# Patient Record
Sex: Male | Born: 1995 | Race: White | Hispanic: No | Marital: Single | Smoking: Current some day smoker
Health system: Southern US, Community
[De-identification: ages and names within clinical notes are randomized; demographics above are authoritative.]

## PROBLEM LIST (undated history)

## (undated) DIAGNOSIS — F913 Oppositional defiant disorder: Secondary | ICD-10-CM

## (undated) DIAGNOSIS — R480 Dyslexia and alexia: Secondary | ICD-10-CM

## (undated) DIAGNOSIS — F909 Attention-deficit hyperactivity disorder, unspecified type: Secondary | ICD-10-CM

## (undated) HISTORY — PX: APPENDECTOMY: SHX54

## (undated) HISTORY — PX: FRACTURE SURGERY: SHX138

---
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
## 2017-02-25 DIAGNOSIS — F902 Attention-deficit hyperactivity disorder, combined type: Secondary | ICD-10-CM | POA: Diagnosis not present

## 2017-05-23 DIAGNOSIS — F902 Attention-deficit hyperactivity disorder, combined type: Secondary | ICD-10-CM | POA: Diagnosis not present

## 2017-06-02 DIAGNOSIS — F4323 Adjustment disorder with mixed anxiety and depressed mood: Secondary | ICD-10-CM | POA: Diagnosis not present

## 2017-09-10 DIAGNOSIS — F902 Attention-deficit hyperactivity disorder, combined type: Secondary | ICD-10-CM | POA: Diagnosis not present

## 2017-10-24 DIAGNOSIS — F331 Major depressive disorder, recurrent, moderate: Secondary | ICD-10-CM | POA: Diagnosis not present

## 2017-10-27 DIAGNOSIS — F331 Major depressive disorder, recurrent, moderate: Secondary | ICD-10-CM | POA: Diagnosis not present

## 2017-10-29 DIAGNOSIS — F331 Major depressive disorder, recurrent, moderate: Secondary | ICD-10-CM | POA: Diagnosis not present

## 2017-10-31 DIAGNOSIS — F331 Major depressive disorder, recurrent, moderate: Secondary | ICD-10-CM | POA: Diagnosis not present

## 2017-11-06 DIAGNOSIS — H6692 Otitis media, unspecified, left ear: Secondary | ICD-10-CM | POA: Diagnosis not present

## 2017-11-06 DIAGNOSIS — F17200 Nicotine dependence, unspecified, uncomplicated: Secondary | ICD-10-CM | POA: Diagnosis not present

## 2017-11-06 DIAGNOSIS — R070 Pain in throat: Secondary | ICD-10-CM | POA: Diagnosis not present

## 2017-11-07 DIAGNOSIS — F331 Major depressive disorder, recurrent, moderate: Secondary | ICD-10-CM | POA: Diagnosis not present

## 2017-11-11 DIAGNOSIS — F331 Major depressive disorder, recurrent, moderate: Secondary | ICD-10-CM | POA: Diagnosis not present

## 2017-11-12 DIAGNOSIS — F902 Attention-deficit hyperactivity disorder, combined type: Secondary | ICD-10-CM | POA: Diagnosis not present

## 2017-11-17 DIAGNOSIS — F331 Major depressive disorder, recurrent, moderate: Secondary | ICD-10-CM | POA: Diagnosis not present

## 2017-11-19 DIAGNOSIS — F331 Major depressive disorder, recurrent, moderate: Secondary | ICD-10-CM | POA: Diagnosis not present

## 2017-11-20 DIAGNOSIS — F331 Major depressive disorder, recurrent, moderate: Secondary | ICD-10-CM | POA: Diagnosis not present

## 2017-11-25 DIAGNOSIS — F331 Major depressive disorder, recurrent, moderate: Secondary | ICD-10-CM | POA: Diagnosis not present

## 2017-12-10 DIAGNOSIS — F331 Major depressive disorder, recurrent, moderate: Secondary | ICD-10-CM | POA: Diagnosis not present

## 2018-03-30 DIAGNOSIS — J0100 Acute maxillary sinusitis, unspecified: Secondary | ICD-10-CM | POA: Diagnosis not present

## 2018-03-30 DIAGNOSIS — R110 Nausea: Secondary | ICD-10-CM | POA: Diagnosis not present

## 2018-03-30 DIAGNOSIS — R509 Fever, unspecified: Secondary | ICD-10-CM | POA: Diagnosis not present

## 2018-05-08 DIAGNOSIS — H6502 Acute serous otitis media, left ear: Secondary | ICD-10-CM | POA: Diagnosis not present

## 2018-05-08 DIAGNOSIS — J398 Other specified diseases of upper respiratory tract: Secondary | ICD-10-CM | POA: Diagnosis not present

## 2018-05-08 DIAGNOSIS — R110 Nausea: Secondary | ICD-10-CM | POA: Diagnosis not present

## 2018-05-08 DIAGNOSIS — Z6820 Body mass index (BMI) 20.0-20.9, adult: Secondary | ICD-10-CM | POA: Diagnosis not present

## 2018-05-08 DIAGNOSIS — F17290 Nicotine dependence, other tobacco product, uncomplicated: Secondary | ICD-10-CM | POA: Diagnosis not present

## 2018-06-18 DIAGNOSIS — J029 Acute pharyngitis, unspecified: Secondary | ICD-10-CM | POA: Diagnosis not present

## 2018-06-18 DIAGNOSIS — F329 Major depressive disorder, single episode, unspecified: Secondary | ICD-10-CM | POA: Diagnosis not present

## 2018-06-23 DIAGNOSIS — F902 Attention-deficit hyperactivity disorder, combined type: Secondary | ICD-10-CM | POA: Diagnosis not present

## 2018-07-30 DIAGNOSIS — F902 Attention-deficit hyperactivity disorder, combined type: Secondary | ICD-10-CM | POA: Diagnosis not present

## 2018-09-07 DIAGNOSIS — J189 Pneumonia, unspecified organism: Secondary | ICD-10-CM | POA: Diagnosis not present

## 2018-09-07 DIAGNOSIS — R05 Cough: Secondary | ICD-10-CM | POA: Diagnosis not present

## 2018-09-07 DIAGNOSIS — J209 Acute bronchitis, unspecified: Secondary | ICD-10-CM | POA: Diagnosis not present

## 2018-10-28 ENCOUNTER — Other Ambulatory Visit: Payer: Self-pay | Admitting: Psychiatry

## 2018-10-29 NOTE — Telephone Encounter (Signed)
Check appt

## 2018-10-30 DIAGNOSIS — J029 Acute pharyngitis, unspecified: Secondary | ICD-10-CM | POA: Diagnosis not present

## 2018-11-05 ENCOUNTER — Ambulatory Visit (INDEPENDENT_AMBULATORY_CARE_PROVIDER_SITE_OTHER): Payer: BLUE CROSS/BLUE SHIELD | Admitting: Psychiatry

## 2018-11-05 ENCOUNTER — Encounter: Payer: Self-pay | Admitting: Psychiatry

## 2018-11-05 VITALS — BP 110/80 | HR 72 | Ht 69.0 in | Wt 154.0 lb

## 2018-11-05 DIAGNOSIS — F3342 Major depressive disorder, recurrent, in full remission: Secondary | ICD-10-CM | POA: Insufficient documentation

## 2018-11-05 DIAGNOSIS — H9325 Central auditory processing disorder: Secondary | ICD-10-CM

## 2018-11-05 DIAGNOSIS — F902 Attention-deficit hyperactivity disorder, combined type: Secondary | ICD-10-CM

## 2018-11-05 DIAGNOSIS — F1210 Cannabis abuse, uncomplicated: Secondary | ICD-10-CM | POA: Diagnosis not present

## 2018-11-05 DIAGNOSIS — F422 Mixed obsessional thoughts and acts: Secondary | ICD-10-CM

## 2018-11-05 DIAGNOSIS — F330 Major depressive disorder, recurrent, mild: Secondary | ICD-10-CM | POA: Diagnosis not present

## 2018-11-05 DIAGNOSIS — F810 Specific reading disorder: Secondary | ICD-10-CM

## 2018-11-05 DIAGNOSIS — F3341 Major depressive disorder, recurrent, in partial remission: Secondary | ICD-10-CM | POA: Insufficient documentation

## 2018-11-05 MED ORDER — SUVOREXANT 15 MG PO TABS: 15.0000 mg | ORAL_TABLET | Freq: Every day | ORAL | 0 refills | Status: DC

## 2018-11-05 MED ORDER — DIAZEPAM 10 MG PO TABS: 5.0000 mg | ORAL_TABLET | Freq: Two times a day (BID) | ORAL | 2 refills | Status: DC | PRN

## 2018-11-05 MED ORDER — LISDEXAMFETAMINE DIMESYLATE 20 MG PO CAPS: 20.0000 mg | ORAL_CAPSULE | Freq: Every day | ORAL | 0 refills | Status: DC

## 2018-11-05 MED ORDER — ESCITALOPRAM OXALATE 20 MG PO TABS: 20.0000 mg | ORAL_TABLET | Freq: Every day | ORAL | 0 refills | Status: DC

## 2018-11-05 NOTE — Progress Notes (Signed)
[facility]  Patient ID: Tristan Mason,  MRN: [id number]  PCP: Tristan Mason, Janet, MD  Date of Evaluation: 11/05/2018 Time spent:20 minutes  Chief Complaint:  Chief Complaint    ADHD; Anxiety; Depression      HISTORY/CURRENT STATUS: Tristan Mason is seen individually for 20 minutes after a 20-minute wait in the lobby as he arrived 20 minutes late just as the patient ahead of him was being taken back from lobby with consent without collateral for psychiatric interview and exam in 5964-month evaluation and management of depression, anxiety, ADHD and cannabis when at last appointment he agreed to follow-up in 4 to 8 weeks.  Patient may be angry for having to wait though his late arrival prevented any other option except rescheduling.  He attests to doing well making all A's at [employer] this semester living at home working at [employer].  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  As in the past, the patient obsessively details how he does not have to follow rules in order to function in life as he considers he is learning sufficiently to make his own way, when he is actually being conservative and compliant in all aspects of his life except cannabis he continues despite mother's advice to stop, but he suggests limited use now.  He concludes therefore that he does not want to take the Trintellix when his depression and anxiety are improved enough that a change can be made back to Lexapro.  He is not clear about how much Trintellix and Lexapro he has been taking recently but almost suggests that he has been alternating both toward switching over to Lexapro alone.  He continues low dose Vyvanse.  He disapproves of the Valium stating that he gets home from work just after midnight and then has to get up early the next morning for class so that the Valium 10 mg taken  upon getting home from work is leaving him sleepy the next morning.  However, he disapproves of considering change to Belsomra as he fixates that Belsomra will be like mirtazapine in the past.  It is not possible to help him change other than by giving him 3 samples of the Belsomra 15 mg to take in place of the Valium at night to assess for better somnorific action.  He is not suicidal or a risk to others.  He has no contact with the ex-girlfriend Verdon CumminsJesse in [place] he narcissistically mourns.  Anxiety  Presents for follow-up visit. Symptoms include compulsions, confusion, decreased concentration, insomnia, nervous/anxious behavior and obsessions. Patient reports no chest pain, depressed mood, dizziness, dry mouth, excessive worry, feeling of choking, hyperventilation, impotence, irritability, malaise, muscle tension, nausea, palpitations, panic, restlessness, shortness of breath or suicidal ideas. Symptoms occur occasionally. The most recent episode lasted 20 minutes. The severity of symptoms is moderate. The quality of sleep is fair. Nighttime awakenings: one to two.   His past medical history is significant for depression. There is no history of suicide attempts. Compliance with medications is 51-75%. Side effects of treatment include GI discomfort.  Depression       The patient presents with depression.  This is a recurrent problem.  The current episode started more than 1 month ago.   The onset quality is sudden.   The problem occurs every several days.  The most recent episode lasted 2 days.  The problem has been gradually improving since onset.  Associated symptoms include decreased concentration, insomnia and headaches.  Associated symptoms include no restlessness and no suicidal ideas.     The symptoms are aggravated by work stress, medication, social issues, family issues and medication withdrawal.  Past treatments include SSRIs - Selective serotonin reuptake inhibitors.  Compliance with  treatment is variable.  Past compliance problems include difficulty with treatment plan and medication issues.  Previous treatment provided moderate relief.  Risk factors include a change in medication usage/dosage, family history of mental illness, family history, history of self-injury and history of suicide attempt.   Past medical history includes anxiety, depression, mental health disorder and obsessive-compulsive disorder.     Pertinent negatives include no chronic fatigue syndrome, no chronic pain, no fibromyalgia, no hypothyroidism, no thyroid problem, no chronic illness, no recent illness, no life-threatening condition, no physical disability, no terminal illness, no recent psychiatric admission, no Alzheimer's disease, no brain trauma, no dementia, no bipolar disorder, no eating disorder, no post-traumatic stress disorder, no schizophrenia, no suicide attempts and no head trauma.   Individual Medical History/ Review of Systems: Changes? :Yes .  He does not currently have headache problems his weight is up 9 pounds having at home with good nutrition and sufficient sleep.  Otherwise 6 systems are negative normal.  Allergies: Patient has no known allergies.  Current Medications:  Current Outpatient Medications:  .  diazepam (VALIUM) 10 MG tablet, Take 0.5 tablets (5 mg total) by mouth 2 (two) times daily as needed for anxiety or sleep., Disp: 30 tablet, Rfl: 2 .  lisdexamfetamine (VYVANSE) 20 MG capsule, Take 1 capsule (20 mg total) by mouth daily after breakfast., Disp: 30 capsule, Rfl: 0 .  benzonatate (TESSALON) 200 MG capsule, Take 1 capsule (200 mg total) by mouth every 8 (eight) hours as needed for cough., Disp: 20 capsule, Rfl: 0 .  escitalopram (LEXAPRO) 20 MG tablet, Take 1 tablet (20 mg total) by mouth at bedtime., Disp: 90 tablet, Rfl: 0 .  [START ON 12/05/2018] lisdexamfetamine (VYVANSE) 20 MG capsule, Take 1 capsule (20 mg total) by mouth daily., Disp: 30 capsule, Rfl: 0 .  [START ON  01/04/2019] lisdexamfetamine (VYVANSE) 20 MG capsule, Take 1 capsule (20 mg total) by mouth daily., Disp: 30 capsule, Rfl: 0 Medication Side Effects: confusion as cognitive dissonance he attributes to Trintellix preferring return to Lexapro stating this as his reason, when clinically his depression and OCD are now sufficiently mild and contained by the structure of daily work, school, and family life he denies as though he does not need structure even when it is helping him.  Family Medical/ Social History: Changes? Yes . The patient is closer with family other than on the marijuana issue, even as he plans upon starting [employer] this winter to move into a studio apartment of his own.  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  He does not believe he will need a major or curriculum guidance toward graduation or grad school as though they will try to please him to him a degree.  MENTAL HEALTH EXAM: Muscle strengths 5/5, postural reflexes 0/0, and AIMS equals 0 Blood pressure 110/80, pulse 72, height 5\' 9"  (1.753 m), weight 154 lb (69.9 kg).Body mass index is 22.74 kg/m.  General Appearance: Casual, Fairly Groomed and Guarded  Eye Contact:  Fair  Speech:  Blocked and Clear and Coherent  Volume:  Normal  Mood:  Anxious, Dysphoric and Euthymic  Affect:  Congruent, Restricted and Anxious  Thought Process:  Goal Directed and Linear  Orientation:  Full (Time, Place, and Person)  Thought Content: Illogical, Obsessions and Rumination   Suicidal Thoughts:  No  Homicidal Thoughts:  No  Memory:  Immediate;   Fair  Judgement:  Intact  Insight:  Fair  Psychomotor Activity:  Decreased and Mannerisms  Concentration:  Concentration: Fair and Attention Span: Fair  Recall:  [employer] of Knowledge: Good  Language: Good  Assets:  Desire for Improvement Financial Resources/Insurance Housing  ADL's:  Intact   Cognition: WNL  Prognosis:  Fair    DIAGNOSES:    ICD-10-CM   1. 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 Mixed obsessional thoughts and acts F42.2 escitalopram (LEXAPRO) 20 MG tablet    diazepam (VALIUM) 10 MG tablet  3. Attention deficit hyperactivity disorder (ADHD), combined type, moderate F90.2 lisdexamfetamine (VYVANSE) 20 MG capsule    lisdexamfetamine (VYVANSE) 20 MG capsule    lisdexamfetamine (VYVANSE) 20 MG capsule  4. Cannabis use disorder, mild, abuse F12.10   5. Auditory processing disorder H93.25   6. 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  We review all aspects of current medications and relinquish a serious trial of Belsomra other than just 3 sample tablets of 15 mg each place of Valium for 3 nights to determine sleep pattern by which he can further assess next steps.  Otherwise Valium as prescribed 10 mg tablet to take 1/2 tablet twice daily as needed for anxiety or insomnia of OCD and depression sending #30 with 2 refills to [facility] [place].  Vyvanse 20 mg every morning is also sent as #30 each for November, December, and January for ADHD.  Trintellix has been discontinued and pharmacy so informed assuring that prescriptions arrived there as sent including starting Lexapro 20 mg nightly #30 with no refill for OCD and depression, to return in 3 months.    Chauncey Mann, MD

## 2018-11-22 ENCOUNTER — Other Ambulatory Visit: Payer: Self-pay | Admitting: Psychiatry

## 2018-11-22 DIAGNOSIS — F422 Mixed obsessional thoughts and acts: Secondary | ICD-10-CM

## 2018-12-30 ENCOUNTER — Encounter: Payer: Self-pay | Admitting: Psychiatry

## 2018-12-30 ENCOUNTER — Ambulatory Visit: Payer: BLUE CROSS/BLUE SHIELD | Admitting: Psychiatry

## 2018-12-30 VITALS — BP 114/76 | HR 78 | Ht 69.0 in | Wt 154.0 lb

## 2018-12-30 DIAGNOSIS — F330 Major depressive disorder, recurrent, mild: Secondary | ICD-10-CM | POA: Diagnosis not present

## 2018-12-30 DIAGNOSIS — F1210 Cannabis abuse, uncomplicated: Secondary | ICD-10-CM | POA: Diagnosis not present

## 2018-12-30 DIAGNOSIS — F902 Attention-deficit hyperactivity disorder, combined type: Secondary | ICD-10-CM

## 2018-12-30 DIAGNOSIS — F422 Mixed obsessional thoughts and acts: Secondary | ICD-10-CM | POA: Diagnosis not present

## 2018-12-30 DIAGNOSIS — H9325 Central auditory processing disorder: Secondary | ICD-10-CM

## 2018-12-30 DIAGNOSIS — F810 Specific reading disorder: Secondary | ICD-10-CM

## 2018-12-30 MED ORDER — ESCITALOPRAM OXALATE 20 MG PO TABS: 20.0000 mg | ORAL_TABLET | Freq: Every day | ORAL | 5 refills | Status: DC

## 2018-12-30 MED ORDER — LISDEXAMFETAMINE DIMESYLATE 20 MG PO CAPS: 20.0000 mg | ORAL_CAPSULE | Freq: Every day | ORAL | 0 refills | Status: DC

## 2018-12-30 MED ORDER — ZOLPIDEM TARTRATE 10 MG PO TABS: 10.0000 mg | ORAL_TABLET | Freq: Every day | ORAL | 5 refills | Status: DC

## 2018-12-30 NOTE — Progress Notes (Signed)
[facility]  Patient ID: NIXEN AVALO,  MRN: [id number]  PCP: Tristan Salmon, MD  Date of Evaluation: 12/30/2018 Time spent:20 minutes  Chief Complaint:  Chief Complaint    ADHD; Anxiety; Depression      HISTORY/CURRENT STATUS: Tristan Mason is seen conjointly with mother face-to-face with consent not collateral for psychiatric interview and exam in 63-month evaluation and management of ADHD, OCD and depression being 2 months overdue.  He failed astronomy class otherwise grades were good at [employer] concluding to return to [employer] instead of [employer] this semester as credits will not transfer to [employer] due to some D's at [employer] in the past.  He and mother are pleased with his Vyvanse and Lexapro preparation for return to [facility] [employer] but conclude to stop Valium due to potential for misuse having cannabis problems among other substance use in the past in that area of the state.  Review of his past treatment with Ambien in 2018 concludes that the medication was stopped as mother had adverse effects such as sleep eating but that Tristan Mason never had these.  They request to change to Ambien finding that samples of Belsomra 15 mg did not work for patient or mother.  He is not in therapy at this time.  His sleep schedule is his most difficult part of the day.  He has no mania, psychosis, self-harm or threats to others.  He has no contact with ex-girlfriend and plans to abstain from relations for the next years.  Anxiety  Presents for follow-up visit. Symptoms include compulsions, decreased concentration, excessive worry, insomnia and obsessions. Patient reports no depressed mood, panic or suicidal ideas. Symptoms occur most days. The severity of symptoms is moderate. The quality of sleep is poor. Nighttime awakenings: several.   Compliance with medications is 51-75%.    Individual Medical History/ Review of Systems: Changes? :No   Allergies: Patient has no known allergies.  Current Medications:  Current Outpatient  Medications:  .  benzonatate (TESSALON) 200 MG capsule, Take 1 capsule (200 mg total) by mouth every 8 (eight) hours as needed for cough., Disp: 20 capsule, Rfl: 0 .  escitalopram (LEXAPRO) 20 MG tablet, Take 1 tablet (20 mg total) by mouth at bedtime., Disp: 30 tablet, Rfl: 5 .  lisdexamfetamine (VYVANSE) 20 MG capsule, Take 1 capsule (20 mg total) by mouth daily after breakfast., Disp: 30 capsule, Rfl: 0 .  [START ON 01/29/2019] lisdexamfetamine (VYVANSE) 20 MG capsule, Take 1 capsule (20 mg total) by mouth daily., Disp: 30 capsule, Rfl: 0 .  [START ON 02/28/2019] lisdexamfetamine (VYVANSE) 20 MG capsule, Take 1 capsule (20 mg total) by mouth daily., Disp: 30 capsule, Rfl: 0 .  Suvorexant (BELSOMRA) 15 MG TABS, Take 15 mg by mouth at bedtime., Disp: 3 tablet, Rfl: 0 .  zolpidem (AMBIEN) 10 MG tablet, Take 1 tablet (10 mg total) by mouth at bedtime., Disp: 30 tablet, Rfl: 5 Medication Side Effects: none  Family Medical/ Social History: Changes? 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  MENTAL HEALTH EXAM: AIMS equals 0, muscle strength 5/5, and postural reflexes 0/0 Blood pressure 114/76, pulse 78, height 5\' 9"  (1.753 m), weight 154 lb (69.9 kg).Body mass index is 22.74 kg/m.  General Appearance: Casual, Fairly Groomed, Guarded and Meticulous  Eye Contact:  Good  Speech:  Clear and Coherent  Volume:  Normal  Mood:  Anxious and Euthymic  Affect:  Full Range and Anxious  Thought Process:  Goal  Directed  Orientation:  Full (Time, Place, and Person)  Thought Content: Obsessions   Suicidal Thoughts:  No  Homicidal Thoughts:  No  Memory:  Immediate;   Good Remote;   Good  Judgement:  Fair  Insight:  Fair  Psychomotor Activity:  Decreased  Concentration:  Concentration: Good and Attention Span: Fair  Recall:  Good  Fund of Knowledge: Good  Language: Good  Assets:  Desire for Improvement Resilience Social Support  ADL's:   Intact  Cognition: WNL  Prognosis:  Fair    DIAGNOSES:    ICD-10-CM   1. 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 Mixed obsessional thoughts and acts F42.2 escitalopram (LEXAPRO) 20 MG tablet  3. Attention deficit hyperactivity disorder (ADHD), combined type, moderate F90.2 lisdexamfetamine (VYVANSE) 20 MG capsule    lisdexamfetamine (VYVANSE) 20 MG capsule    lisdexamfetamine (VYVANSE) 20 MG capsule  4. Cannabis use disorder, mild, abuse F12.10   5. 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  Identification with mother allows understanding for both of symptom management and monitoring and prevention.  Safety hygiene and crisis plans are outlined if needed actually for the [facility] area having therapy contact there from past substance use now ceased or controlled.  He is safe for starting Ambien 10 mg nightly prescribed his #30 with 5 refills sent to [facility] [place] for insomnia associated with anxiety, depression and ADHD letter with mother.  Lexapro 20 mg nightly sent as #30 with 5 refills to [facility] is for OCD and depression.  Vyvanse is prescribed 20 mg every morning as a month supply each for January, February, and March with mother and patient to coordinate any quit need until follow-up in 6 months for ADHD while at [facility].   Tristan Mann, MD

## 2019-01-03 DIAGNOSIS — F1210 Cannabis abuse, uncomplicated: Secondary | ICD-10-CM | POA: Insufficient documentation

## 2019-01-03 DIAGNOSIS — F88 Other disorders of psychological development: Secondary | ICD-10-CM

## 2019-01-03 DIAGNOSIS — F810 Specific reading disorder: Secondary | ICD-10-CM

## 2019-01-03 DIAGNOSIS — F422 Mixed obsessional thoughts and acts: Secondary | ICD-10-CM

## 2019-02-04 ENCOUNTER — Ambulatory Visit: Payer: BLUE CROSS/BLUE SHIELD | Admitting: Psychiatry

## 2019-04-08 ENCOUNTER — Ambulatory Visit (INDEPENDENT_AMBULATORY_CARE_PROVIDER_SITE_OTHER): Payer: BLUE CROSS/BLUE SHIELD | Admitting: Psychiatry

## 2019-04-08 ENCOUNTER — Other Ambulatory Visit: Payer: Self-pay

## 2019-04-08 ENCOUNTER — Encounter: Payer: Self-pay | Admitting: Psychiatry

## 2019-04-08 ENCOUNTER — Telehealth: Payer: Self-pay | Admitting: Psychiatry

## 2019-04-08 DIAGNOSIS — F422 Mixed obsessional thoughts and acts: Secondary | ICD-10-CM | POA: Diagnosis not present

## 2019-04-08 DIAGNOSIS — H9325 Central auditory processing disorder: Secondary | ICD-10-CM

## 2019-04-08 DIAGNOSIS — F902 Attention-deficit hyperactivity disorder, combined type: Secondary | ICD-10-CM

## 2019-04-08 DIAGNOSIS — F3341 Major depressive disorder, recurrent, in partial remission: Secondary | ICD-10-CM

## 2019-04-08 DIAGNOSIS — F1210 Cannabis abuse, uncomplicated: Secondary | ICD-10-CM

## 2019-04-08 DIAGNOSIS — F810 Specific reading disorder: Secondary | ICD-10-CM

## 2019-04-08 MED ORDER — LISDEXAMFETAMINE DIMESYLATE 20 MG PO CAPS: 20.0000 mg | ORAL_CAPSULE | Freq: Every day | ORAL | 0 refills | Status: DC

## 2019-04-08 MED ORDER — DIAZEPAM 10 MG PO TABS: 10.0000 mg | ORAL_TABLET | Freq: Every evening | ORAL | 0 refills | Status: DC | PRN

## 2019-04-08 NOTE — Telephone Encounter (Signed)
Jameis requests that we send the Valium 10 mg nightly as needed for sleep #30 no refill for OCD and depression escription to [facility] [place] on [address] rather than to the [facility] store who request that we cancel the prescription there and send it directly to the store in [place] rather than them to transfer it or family mailing it.

## 2019-04-08 NOTE — Progress Notes (Signed)
[facility]  Patient ID: Tristan Mason,  MRN: [id number]  PCP: Tristan Salmon, MD  Date of Evaluation: 04/08/2019 Time spent:20 minutes from 0900 to 0930  I connected with patient by a video enabled telemedicine application or telephone, with their informed consent, and verified patient privacy and that I am speaking with the correct person using two identifiers.  I was located at [employer] and patient at maternal uncle's home in [place] individually.  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  Mother sent the patient to [place] last July when he developed major depression which he confirms currently to have been associated with break-up with girlfriend Tristan Mason and to have triggered his participation briefly in outpatient substance abuse program in [place] not ever clarified as totally completed and remitted.  He still uses cannabis 2 or 3 days weekly in modest amount and does not manifest psychiatric criteria for dependence though he may be restricting information.  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  Patient is getting up on time in the morning with uncle but he had no schedule at home, starting this appointment immediately at 0900 when he usually misses appointments when at parents.  He is online with [employer] courses and finds himself more stressed for OCD symptoms.  Lexapro and Vyvanse have worked well since  last appointment 3 months ago after course of mirtazapine when in [place] and then [employer].  He requests at this time to change his Ambien which mother required present at last appointment when he was less secure in his break-up with girlfriend and depression now having more OCD requesting to change back to the Valium for benefit for benefit to OCD.  However he plans a one-month course of Valium in conjunction with assessment by mother so that he can decide at that point to go back to the Ambien of which he has 3 refills remaining from 12/30/2018 if Valium no better for OCD. He has no suicidality, mania, dissociation, or psychosis.  Ambien is not helping insomnia any longer.  Depression       The patient presents with depression.  This is a recurrent problem.  The current episode started more than 1 month ago.   The onset quality is sudden.   The problem occurs intermittently.  The problem has been gradually improving since onset.  Associated symptoms include decreased concentration, insomnia and sad.  Associated symptoms include no helplessness, no hopelessness, no decreased interest, no appetite change, no headaches, no indigestion and no suicidal ideas.     The symptoms are aggravated by work stress, social issues and family issues.  Past treatments include SSRIs - Selective serotonin reuptake inhibitors, SNRIs - Serotonin and norepinephrine reuptake inhibitors, psychotherapy and other medications.  Compliance with treatment is variable and good.  Past compliance problems include difficulty with treatment plan and medication issues.  Previous treatment provided moderate relief.  Risk factors include a change in medication usage/dosage, family history, family history of mental illness, history of mental illness,  history of self-injury, history of suicide attempt, illicit drug use, prior psychiatric admission, major life event and stress.   Past medical history includes anxiety, bipolar disorder, depression,  mental health disorder, obsessive-compulsive disorder and suicide attempts.     Pertinent negatives include no life-threatening condition, no physical disability, no recent psychiatric admission, no eating disorder, no post-traumatic stress disorder, no schizophrenia and no head trauma.   Individual Medical History/ Review of Systems: Changes? :No   Allergies: Patient has no known allergies.  Current Medications:  Current Outpatient Medications:  .  benzonatate (TESSALON) 200 MG capsule, Take 1 capsule (200 mg total) by mouth every 8 (eight) hours as needed for cough., Disp: 20 capsule, Rfl: 0 .  diazepam (VALIUM) 10 MG tablet, Take 1 tablet (10 mg total) by mouth at bedtime as needed for sleep., Disp: 30 tablet, Rfl: 0 .  escitalopram (LEXAPRO) 20 MG tablet, Take 1 tablet (20 mg total) by mouth at bedtime., Disp: 30 tablet, Rfl: 5 .  lisdexamfetamine (VYVANSE) 20 MG capsule, Take 1 capsule (20 mg total) by mouth daily after breakfast for 30 days., Disp: 30 capsule, Rfl: 0 .  [START ON 05/08/2019] lisdexamfetamine (VYVANSE) 20 MG capsule, Take 1 capsule (20 mg total) by mouth daily after breakfast for 30 days., Disp: 30 capsule, Rfl: 0 .  [START ON 06/07/2019] lisdexamfetamine (VYVANSE) 20 MG capsule, Take 1 capsule (20 mg total) by mouth daily after breakfast for 30 days., Disp: 30 capsule, Rfl: 0 .  vortioxetine HBr (TRINTELLIX) 20 MG TABS tablet, Take 20 mg by mouth daily., Disp: , Rfl:    Medication Side Effects: none  Family Medical/ Social History: Changes? Yes staying with maternal uncle in [place] working with him and completing online courses for [employer].  MENTAL HEALTH EXAM:  There were no vitals taken for this visit.There is no height or weight on file to calculate BMI.as not present here  General Appearance: N/A  Eye Contact:  N/A  Speech:  Clear and Coherent, Normal Rate and Talkative  Volume:  Normal  Mood:  Anxious, Dysphoric and Euthymic  Affect:  Appropriate,  Full Range and Anxious  Thought Process:  Goal Directed, Irrelevant and Linear  Orientation:  Other:  Oriented x4  Thought Content: Ilusions, Obsessions and Rumination   Suicidal Thoughts:  No  Homicidal Thoughts:  No  Memory:  Immediate;   Good Remote;   Good  Judgement:  Fair  Insight:  Fair  Psychomotor Activity:  Increased and Mannerisms  Concentration:  Concentration: Fair and Attention Span: Fair  Recall:  Good  Fund of Knowledge: Good  Language: Good  Assets:  Desire for Improvement Leisure Time Talents/Skills  ADL's:  Intact  Cognition: WNL  Prognosis:  Good    DIAGNOSES:    ICD-10-CM   1. Depression, major, recurrent, in partial remission (HCC) F33.41 diazepam (VALIUM) 10 MG tablet  2. Mixed obsessional thoughts and acts F42.2 diazepam (VALIUM) 10 MG tablet  3. Attention deficit hyperactivity disorder (ADHD), combined type, moderate F90.2 lisdexamfetamine (VYVANSE) 20 MG capsule    lisdexamfetamine (VYVANSE) 20 MG capsule    lisdexamfetamine (VYVANSE) 20 MG capsule  4. Cannabis use disorder, mild, abuse F12.10   5. 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 Auditory processing disorder H93.25     Receiving Psychotherapy: No    RECOMMENDATIONS: Patient initially gives the impression he is in [place] but relative having [facility] [place] fill his escription and then mother to mail to  him or he to pick it up next weekend, they request to send the [facility] to [facility] in [place], [place] on [address] discontinuing Ambien and changing to Valium 10 mg at bedtime as needed for insomnia and obsessive-compulsive disorder fixations.  He is E scribed Vyvanse 20 mg every morning for ADHD sent as #30 each for April, May, and June having small current supply.  He takes Lexapro 20 mg nightly having 82-month fills at [facility] for OCD and depression. He returns in 3 months understanding safety hygiene and  crisis and safety plans for his medications for diagnoses including the Valium he and mother monitor.  Virtual Visit via Telephone Note  I connected with Tristan Mason on 04/10/19 at  9:00 AM EDT by telephone and verified that I am speaking with the correct person using two identifiers.   I discussed the limitations, risks, security and privacy concerns of performing an evaluation and management service by telephone and the availability of in person appointments. I also discussed with the patient that there may be a patient responsible charge related to this service. The patient expressed understanding and agreed to proceed.   History of Present Illness: Major depression he confirms currently to have been associated with break-up with girlfriend Tristan Mason and to have triggered his participation briefly in outpatient substance abuse program in [place] not ever clarified as totally completed and remitted.  He still uses cannabis 2 or 3 days weekly in modest amount and does not manifest psychiatric criteria for dependence though he may be restricting information.  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  Patient is getting up on time in the morning with uncle but he had no schedule at home, starting this appointment immediately at 0900 when he usually misses appointments when at parents.  He is online with [employer] courses and finds himself more stressed for OCD symptoms.  Lexapro and Vyvanse have worked well since last appointment 3 months ago after course of mirtazapine when in [place] and then [employer].  He requests at this time to change his Ambien which mother required present at last appointment when he was less secure in his break-up with girlfriend and depression now having more OCD requesting to change back to the Valium for benefit for benefit  to OCD.  However he plans a one-month course of Valium in conjunction with assessment by mother so that he can decide at that point to go back to the Ambien of which he has 3 refills remaining from 12/30/2018 if Valium no better for OCD.   Observations/Objective: Mood:  Anxious, Dysphoric and Euthymic  Affect:  Appropriate, Full Range and Anxious  Thought Process:  Goal Directed, Irrelevant and Linear  Orientation:  Other:  Oriented x4  Thought Content: Ilusions, Obsessions and Rumination     Assessment and Plan: Discontinuing Ambien and changing to Valium 10 mg at bedtime as needed for insomnia and obsessive-compulsive disorder fixations.is , he is escribed Vyvanse 20 mg every morning for ADHD sent as #30 each for April, May, and June having small current supply.  He takes Lexapro 20 mg nightly having 60-month fill at [facility]  OCD and depression.   Follow Up Instructions: He returns in 3 months understanding safety hygiene and crisis and safety plans for his medications for diagnoses including the Valium he and mother monitor.  I discussed the assessment and treatment plan with the patient. The patient was provided an opportunity to ask questions and all were answered. The patient agreed with the plan and demonstrated an understanding of the instructions.   The patient was advised to call back or seek an in-person evaluation if the symptoms worsen or if the condition fails to improve as anticipated.  I provided 20 minutes of non-face-to-face time during this encounter.   Chauncey Mann, MD  Chauncey Mann, MD

## 2019-04-08 NOTE — Telephone Encounter (Signed)
Pt needs rx for Valium sent to [address] [place], [place] he is completely out. Valium called [facility] [place]  But pt needs it called in to [facility].

## 2019-05-06 ENCOUNTER — Other Ambulatory Visit: Payer: Self-pay | Admitting: Psychiatry

## 2019-05-06 DIAGNOSIS — F3341 Major depressive disorder, recurrent, in partial remission: Secondary | ICD-10-CM

## 2019-05-06 DIAGNOSIS — F422 Mixed obsessional thoughts and acts: Secondary | ICD-10-CM

## 2019-05-07 NOTE — Telephone Encounter (Signed)
Looks like the request is for duplicate requests for his Valium

## 2019-05-07 NOTE — Telephone Encounter (Signed)
Curious refill request from [facility] in [place] for both the current Valium 10 mg nightly as needed for insomnia started at last appointment 04/08/2019 and the Valium discontinued from last December in regard to loss of responsible use self medicating with cannabis.  Partial recovery allows treatment of baseline pathology now appropriate only to have the 10 mg at bedtime as needed Valium No. 30 with no refill sent to [facility] in [place].

## 2019-06-01 DIAGNOSIS — Z0289 Encounter for other administrative examinations: Secondary | ICD-10-CM

## 2019-06-21 ENCOUNTER — Telehealth: Payer: Self-pay | Admitting: Psychiatry

## 2019-06-21 DIAGNOSIS — F902 Attention-deficit hyperactivity disorder, combined type: Secondary | ICD-10-CM

## 2019-06-21 MED ORDER — LISDEXAMFETAMINE DIMESYLATE 20 MG PO CAPS: 20.0000 mg | ORAL_CAPSULE | Freq: Every day | ORAL | 0 refills | Status: DC

## 2019-06-21 NOTE — Telephone Encounter (Signed)
Patient filled on 05/21/2019 his 04/08/2019 1st eScription for 20 mg #30 now requesting the second fill to be sent instead to [place] at [place] only and are in [place] which is done notifying greens in [place] that delete that fill from there computer.  Also note on [place] registry that the patient has been refilling Ambien 12/30/2018 prescription instead of Valium last 2 months at his 3 in [place] per [place] registry.

## 2019-06-21 NOTE — Telephone Encounter (Signed)
Patient called stated prescription for Vyvanse 20 mg., need to be sent to [facility] in [facility] [address] [facility] [address]

## 2019-08-09 ENCOUNTER — Other Ambulatory Visit: Payer: Self-pay

## 2019-08-09 ENCOUNTER — Encounter: Payer: Self-pay | Admitting: Psychiatry

## 2019-08-09 ENCOUNTER — Ambulatory Visit (INDEPENDENT_AMBULATORY_CARE_PROVIDER_SITE_OTHER): Payer: BLUE CROSS/BLUE SHIELD | Admitting: Psychiatry

## 2019-08-09 VITALS — Ht 69.0 in | Wt 153.0 lb

## 2019-08-09 DIAGNOSIS — F810 Specific reading disorder: Secondary | ICD-10-CM

## 2019-08-09 DIAGNOSIS — F1210 Cannabis abuse, uncomplicated: Secondary | ICD-10-CM

## 2019-08-09 DIAGNOSIS — F422 Mixed obsessional thoughts and acts: Secondary | ICD-10-CM

## 2019-08-09 DIAGNOSIS — F902 Attention-deficit hyperactivity disorder, combined type: Secondary | ICD-10-CM

## 2019-08-09 DIAGNOSIS — H9325 Central auditory processing disorder: Secondary | ICD-10-CM

## 2019-08-09 DIAGNOSIS — F3342 Major depressive disorder, recurrent, in full remission: Secondary | ICD-10-CM

## 2019-08-09 MED ORDER — LISDEXAMFETAMINE DIMESYLATE 30 MG PO CAPS: 30.0000 mg | ORAL_CAPSULE | Freq: Every day | ORAL | 0 refills | Status: DC

## 2019-08-09 MED ORDER — ESCITALOPRAM OXALATE 20 MG PO TABS: 20.0000 mg | ORAL_TABLET | Freq: Every day | ORAL | 3 refills | Status: DC

## 2019-08-09 MED ORDER — DIAZEPAM 10 MG PO TABS: 10.0000 mg | ORAL_TABLET | Freq: Every evening | ORAL | 2 refills | Status: DC | PRN

## 2019-08-09 NOTE — Progress Notes (Signed)
[facility]  Patient ID: Tristan HousemanJacob W Mason,  MRN: [id number]  PCP: Tristan Salmonees, Janet, MD  Date of Evaluation: 08/09/2019 Time spent:15 minutes from 1300 1315  Chief Complaint:  Chief Complaint    Depression; Anxiety; ADHD      HISTORY/CURRENT STATUS: Tristan PilgrimJacob is provided telemedicine audiovisual appointment session, though he declines the video camera in his OCD and college residence status, individually with consent with epic collateral for psychiatric interview and exam in 837-month evaluation and management of ADHD/OCD, major depression in remission, less cannabis, and learning variances.  As the patient has academic success again, he migrates to the usual recurrent relationship with ex-girlfriend Verdon CumminsJesse he states attends therapy weekly including group therapy but is off medication.  He considers that his mother is accepting of their current relationship after his decompensation in substance use when they previously broke up.  He is using cannabis less and is honest with mother about such usually 3 times weekly only after 6 PM when school work is done.  He had a good spring semester with 2B's in difficult classes including anatomy and physiology with embryology.  He has 4-week summer school completed and is ready to start the fall semester.  His last [place] registry entry is Vyvanse on 06/21/2019, and he is honest about having restarted Ambien remaining supply after he did not recontact about the Valium 10 mg nightly.  He conncludes that Valium helps most and takes it only at bedtime, while mother prefers Ambien herself.  He discusses increasing Vyvanse now that OCD symptoms are improved and nutrition and activity are normalized.  He has a new apartment with 2 roommates all of whom are diligent students in postgraduate studies.  He has no suicidality, mania, psychosis, or delirium.  Depression        This is a recurrent problem now in remission with last episode starting more than 6 month ago.   The  onset quality is sudden.   The problem occurs intermittently.  The problem has become fully improved. Associated symptoms include decreased concentration and insomnia.  Associated symptoms include no helplessness, no hopelessness, no decreased interest, no appetite change, no headaches, no indigestion and no suicidal ideas.     The symptoms are aggravated by work stress, social issues and family issues.  Past treatments include SSRIs - Selective serotonin reuptake inhibitors, SNRIs - Serotonin and norepinephrine reuptake inhibitors, psychotherapy and other medications.  Compliance with treatment is variable and good.  Past compliance problems include difficulty with treatment plan and medication issues.  Previous treatment provided moderate relief.  Risk factors include a change in medication usage/dosage, family history, family history of mental illness, history of mental illness, history of self-injury, history of suicide attempt, illicit drug use, prior psychiatric admission, major life event and stress.   Past medical history includes anxiety, bipolar disorder, depression, mental health disorder, obsessive-compulsive disorder and suicide attempts.     Pertinent negatives include no life-threatening condition, no physical disability, no recent psychiatric admission, no eating disorder, no post-traumatic stress disorder, no schizophrenia and no head trauma.  Individual Medical History/ Review of Systems: Changes? :Yes Reporting himself weight gain of 10 pounds to 153 pounds no medical illness symptoms.  Is been off Vyvanse 20 mg the last 2 weeks awaiting the start of the fall semester otherwise compliant during the school year.  Continues Lexapro and reports need for facilitation of sleep relative to OCD.  Allergies: Patient has no known allergies.  Current Medications:  Current Outpatient Medications:  .  benzonatate (TESSALON) 200 MG capsule, Take 1 capsule (200 mg total) by mouth every 8 (eight) hours  as needed for cough., Disp: 20 capsule, Rfl: 0 .  diazepam (VALIUM) 10 MG tablet, Take 1 tablet (10 mg total) by mouth at bedtime as needed for anxiety or sleep., Disp: 30 tablet, Rfl: 2 .  escitalopram (LEXAPRO) 20 MG tablet, Take 1 tablet (20 mg total) by mouth at bedtime., Disp: 30 tablet, Rfl: 3 .  lisdexamfetamine (VYVANSE) 30 MG capsule, Take 1 capsule (30 mg total) by mouth daily after breakfast., Disp: 30 capsule, Rfl: 0 .  [START ON 09/08/2019] lisdexamfetamine (VYVANSE) 30 MG capsule, Take 1 capsule (30 mg total) by mouth daily after breakfast., Disp: 30 capsule, Rfl: 0 .  [START ON 10/08/2019] lisdexamfetamine (VYVANSE) 30 MG capsule, Take 1 capsule (30 mg total) by mouth daily after breakfast., Disp: 30 capsule, Rfl: 0 .  vortioxetine HBr (TRINTELLIX) 20 MG TABS tablet, Take 20 mg by mouth daily., Disp: , Rfl:    Medication Side Effects: none  Family Medical/ Social History: Changes? Yes [place]  [place]:  Height 5\' 9"  (1.753 m), weight 153 lb (69.4 kg).Body mass index is 22.59 kg/m.  Others deferred for coronavirus pandemic  General Appearance: N/A  Eye Contact:  N/A  Speech:  Clear and Coherent, Normal Rate and Talkative  Volume:  Normal  Mood:  Anxious, Euthymic and Worthless  Affect:  Congruent, Inappropriate, Full Range and Anxious  Thought Process:  Coherent, Goal Directed, Irrelevant and Linear  Orientation:  Full (Time, Place, and Person)  Thought Content: Ilusions, Obsessions and Rumination   Suicidal Thoughts:  No  Homicidal Thoughts:  No  Memory:  Immediate;   Good Remote;   Good  Judgement:  Fair  Insight:  Fair  Psychomotor Activity:  Normal, Increased, Mannerisms and Restlessness  Concentration:  Concentration: Fair and Attention Span: Fair  Recall:  Good  Fund of Knowledge: Good  Language: Good  Assets:  Desire for Improvement Resilience Vocational/Educational  ADL's:  Intact   Cognition: WNL  Prognosis:  Good    DIAGNOSES:    ICD-10-CM   1. Recurrent major depression in full remission ([place])  F33.42 escitalopram (LEXAPRO) 20 MG tablet  2. Mixed obsessional thoughts and acts  F42.2 escitalopram (LEXAPRO) 20 MG tablet    diazepam (VALIUM) 10 MG tablet  3. Attention deficit hyperactivity disorder (ADHD), combined type, moderate  F90.2 lisdexamfetamine (VYVANSE) 30 MG capsule    lisdexamfetamine (VYVANSE) 30 MG capsule    lisdexamfetamine (VYVANSE) 30 MG capsule  4. Auditory processing disorder  H93.25   5. Specific learning disorder, with impairment in reading, mild  F81.0   6. Cannabis use disorder, mild, abuse  F12.10     Receiving Psychotherapy: No    RECOMMENDATIONS: Over 50% of the time is spent in counseling and coordination of care essential to compliance, historical tendency to regress to his usual mean, and critical need to complete transition to adult life.  Cognitive behavioral sleep hygiene, nutrition, social skills, and frustration tolerance are integrated with medications for symptom treatment matching.  He is E scribed Vyvanse increased to 30 mg every morning sent as a [facility] [place] [address], September 16, and October 16.  He is E scribed Valium 10 mg at bedtime as needed for sleep and obsessional anxiety #30 with 2 refills sent to [facility] in [place].  He is  E scribed Lexapro 1 mg every bedtime #30 with 3 refills [facility] [place] for OCD and depression.  He will have follow-up in 3 months.  Virtual Visit via Video Note  I connected with Tristan HousemanJacob W Mason on 08/09/19 at  1:00 PM EDT by a video enabled telemedicine application and verified that I am speaking with the correct person using two identifiers.  Location: Patient: Individually at apartment residence in [place] after just having lunch with parents Provider: [facility]   I discussed the  limitations of evaluation and management by telemedicine and the availability of in person appointments. The patient expressed understanding and agreed to proceed.  History of Present Illness: 3188-month evaluation and management address ADHD/OCD, major depression in remission, less cannabis, and learning variances.   Observations/Objective: Mood:  Anxious, Euthymic and Worthless  Affect:  Congruent, Inappropriate, Full Range and Anxious  Thought Process:  Coherent, Goal Directed, Irrelevant and Linear  Orientation:  Full (Time, Place, and Person)  Thought Content: Ilusions, Obsessions and Rumination     Assessment and Plan: Over 50% of the time is spent in counseling and coordination of care essential to compliance, historical tendency to regress to his usual mean, and critical need to complete transition to adult life.  Cognitive behavioral sleep hygiene, nutrition, social skills, and frustration tolerance are integrated with medications for symptom treatment matching.  He is E scribed Vyvanse increased to 30 mg every morning sent as a [facility] supply [facility] [place] for [place] 17, September 16, and October 16.  He is E scribed Valium 10 mg at bedtime as needed for sleep and obsessional anxiety #30 with 2 refills sent [facility] [place].  He is E scribed Lexapro 1 mg every bedtime #30 with 3 refills [facility] [place] for OCD and depression.   Follow Up Instructions: He will have follow-up in 3 months.    I discussed the assessment and treatment plan with the patient. The patient was provided an opportunity to ask questions and all were answered. The patient agreed with the plan and demonstrated an understanding of the instructions.   The patient was advised to call back or seek an in-person evaluation if the symptoms worsen or if the condition fails to improve as anticipated.  I provided 15 minutes of non-face-to-face time during this  encounter. [employer] meeting #[phone] Meeting password: [email or web address]  Chauncey MannGlenn E Jennings, MD   Chauncey MannGlenn E Jennings, MD

## 2019-09-09 DIAGNOSIS — Z1159 Encounter for screening for other viral diseases: Secondary | ICD-10-CM | POA: Diagnosis not present

## 2019-11-02 ENCOUNTER — Other Ambulatory Visit: Payer: Self-pay

## 2019-11-02 ENCOUNTER — Encounter: Payer: Self-pay | Admitting: Psychiatry

## 2019-11-02 ENCOUNTER — Ambulatory Visit (INDEPENDENT_AMBULATORY_CARE_PROVIDER_SITE_OTHER): Payer: BC Managed Care – PPO | Admitting: Psychiatry

## 2019-11-02 DIAGNOSIS — F1210 Cannabis abuse, uncomplicated: Secondary | ICD-10-CM

## 2019-11-02 DIAGNOSIS — F902 Attention-deficit hyperactivity disorder, combined type: Secondary | ICD-10-CM

## 2019-11-02 DIAGNOSIS — F810 Specific reading disorder: Secondary | ICD-10-CM

## 2019-11-02 DIAGNOSIS — F3341 Major depressive disorder, recurrent, in partial remission: Secondary | ICD-10-CM | POA: Diagnosis not present

## 2019-11-02 DIAGNOSIS — F422 Mixed obsessional thoughts and acts: Secondary | ICD-10-CM

## 2019-11-02 DIAGNOSIS — H9325 Central auditory processing disorder: Secondary | ICD-10-CM

## 2019-11-02 MED ORDER — LISDEXAMFETAMINE DIMESYLATE 30 MG PO CAPS: 30.0000 mg | ORAL_CAPSULE | Freq: Every day | ORAL | 0 refills | Status: DC

## 2019-11-02 MED ORDER — ESCITALOPRAM OXALATE 20 MG PO TABS: 20.0000 mg | ORAL_TABLET | Freq: Every day | ORAL | 2 refills | Status: DC

## 2019-11-02 MED ORDER — DIAZEPAM 10 MG PO TABS: 10.0000 mg | ORAL_TABLET | Freq: Every evening | ORAL | 2 refills | Status: DC | PRN
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
## 2019-11-11 ENCOUNTER — Telehealth: Payer: Self-pay | Admitting: Psychiatry

## 2019-11-11 NOTE — Telephone Encounter (Signed)
Patient phones to be told his diagnoses as mother is telling him he must have bipolar because he achieves in school 1 month and does very little the next as patient states he wants to know if he is being lazy or he has some type of mood disorder that alters his motivation.  He has been attempting to understand and help his girlfriend who is more perplexing to him that he is to his family, and he states he would like to know if he has narcissism or antisocial personality.  He is thinking he might change medications if he had new diagnoses rather than necessarily applying what he learns to change his behavior for academics, employment, and relationships.  He is not currently symptomatic significantly stating his most difficult symptoms on a scale of 1-10 are a 4 sometimes but overall he is comfortable.  He asks for a psychologist who can statistically measure his diagnoses and is given the reference of [facility] in this area though as he is more often in [place] he might consider [facility].

## 2019-11-22 ENCOUNTER — Telehealth: Payer: Self-pay | Admitting: Psychiatry

## 2019-11-22 DIAGNOSIS — F902 Attention-deficit hyperactivity disorder, combined type: Secondary | ICD-10-CM

## 2019-11-22 MED ORDER — LISDEXAMFETAMINE DIMESYLATE 30 MG PO CAPS: 30.0000 mg | ORAL_CAPSULE | Freq: Every day | ORAL | 0 refills | Status: DC

## 2019-11-22 NOTE — Telephone Encounter (Signed)
Pt called stating he is currently back in town due to school out on break. He is requesting that his Vyvanse refills be sent to the [facility] in [place] until further notice. He will notify us when he would like them transferred back to the  [place] location.
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
## 2019-11-22 NOTE — Telephone Encounter (Signed)
Only received #10 on 11/13

## 2019-12-27 ENCOUNTER — Encounter: Payer: Self-pay | Admitting: Psychiatry

## 2019-12-27 ENCOUNTER — Ambulatory Visit (INDEPENDENT_AMBULATORY_CARE_PROVIDER_SITE_OTHER): Payer: BC Managed Care – PPO | Admitting: Psychiatry

## 2019-12-27 DIAGNOSIS — F1210 Cannabis abuse, uncomplicated: Secondary | ICD-10-CM

## 2019-12-27 DIAGNOSIS — H9325 Central auditory processing disorder: Secondary | ICD-10-CM

## 2019-12-27 DIAGNOSIS — F3341 Major depressive disorder, recurrent, in partial remission: Secondary | ICD-10-CM

## 2019-12-27 DIAGNOSIS — F422 Mixed obsessional thoughts and acts: Secondary | ICD-10-CM

## 2019-12-27 DIAGNOSIS — F810 Specific reading disorder: Secondary | ICD-10-CM

## 2019-12-27 DIAGNOSIS — F902 Attention-deficit hyperactivity disorder, combined type: Secondary | ICD-10-CM

## 2019-12-27 MED ORDER — LISDEXAMFETAMINE DIMESYLATE 30 MG PO CAPS: 30.0000 mg | ORAL_CAPSULE | Freq: Every day | ORAL | 0 refills | Status: DC

## 2019-12-27 MED ORDER — ESCITALOPRAM OXALATE 20 MG PO TABS: 20.0000 mg | ORAL_TABLET | Freq: Every day | ORAL | 2 refills | Status: DC

## 2019-12-27 MED ORDER — DIAZEPAM 10 MG PO TABS: 10.0000 mg | ORAL_TABLET | Freq: Every evening | ORAL | 2 refills | Status: DC | PRN
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
## 2020-01-27 DIAGNOSIS — R438 Other disturbances of smell and taste: Secondary | ICD-10-CM | POA: Diagnosis not present

## 2020-01-27 DIAGNOSIS — R5383 Other fatigue: Secondary | ICD-10-CM | POA: Diagnosis not present

## 2020-01-27 DIAGNOSIS — Z1152 Encounter for screening for COVID-19: Secondary | ICD-10-CM | POA: Diagnosis not present

## 2020-04-03 ENCOUNTER — Other Ambulatory Visit: Payer: Self-pay

## 2020-04-03 ENCOUNTER — Telehealth: Payer: Self-pay | Admitting: Psychiatry

## 2020-04-03 DIAGNOSIS — F902 Attention-deficit hyperactivity disorder, combined type: Secondary | ICD-10-CM

## 2020-04-03 MED ORDER — LISDEXAMFETAMINE DIMESYLATE 30 MG PO CAPS: 30.0000 mg | ORAL_CAPSULE | Freq: Every day | ORAL | 0 refills | Status: DC

## 2020-04-03 NOTE — Telephone Encounter (Signed)
Please RF Vyvanse 30mg  for pt. He is making appt soon, but has finals this week. [facility] on [address], [place], [place]

## 2020-04-03 NOTE — Telephone Encounter (Signed)
Last refill 02/28/2020, pended for Dr. Marlyne Beards to submit Has apt 04/25/2020

## 2020-04-25 ENCOUNTER — Ambulatory Visit: Payer: BC Managed Care – PPO | Admitting: Psychiatry

## 2020-05-10 DIAGNOSIS — K529 Noninfective gastroenteritis and colitis, unspecified: Secondary | ICD-10-CM | POA: Diagnosis not present

## 2020-05-10 DIAGNOSIS — R1032 Left lower quadrant pain: Secondary | ICD-10-CM | POA: Diagnosis not present

## 2020-05-10 DIAGNOSIS — F17200 Nicotine dependence, unspecified, uncomplicated: Secondary | ICD-10-CM | POA: Diagnosis not present

## 2020-07-10 ENCOUNTER — Other Ambulatory Visit: Payer: Self-pay

## 2020-07-10 ENCOUNTER — Encounter: Payer: Self-pay | Admitting: Psychiatry

## 2020-07-10 ENCOUNTER — Ambulatory Visit (INDEPENDENT_AMBULATORY_CARE_PROVIDER_SITE_OTHER): Payer: BC Managed Care – PPO | Admitting: Psychiatry

## 2020-07-10 VITALS — Ht 69.0 in | Wt 163.0 lb

## 2020-07-10 DIAGNOSIS — F810 Specific reading disorder: Secondary | ICD-10-CM

## 2020-07-10 DIAGNOSIS — F3341 Major depressive disorder, recurrent, in partial remission: Secondary | ICD-10-CM

## 2020-07-10 DIAGNOSIS — F422 Mixed obsessional thoughts and acts: Secondary | ICD-10-CM | POA: Diagnosis not present

## 2020-07-10 DIAGNOSIS — F902 Attention-deficit hyperactivity disorder, combined type: Secondary | ICD-10-CM

## 2020-07-10 DIAGNOSIS — H9325 Central auditory processing disorder: Secondary | ICD-10-CM

## 2020-07-10 MED ORDER — LISDEXAMFETAMINE DIMESYLATE 20 MG PO CAPS: 20.0000 mg | ORAL_CAPSULE | Freq: Every day | ORAL | 0 refills | Status: DC

## 2020-07-10 MED ORDER — ESCITALOPRAM OXALATE 20 MG PO TABS: 20.0000 mg | ORAL_TABLET | Freq: Every day | ORAL | 3 refills | Status: DC

## 2020-07-10 MED ORDER — ZOLPIDEM TARTRATE 10 MG PO TABS: 10.0000 mg | ORAL_TABLET | Freq: Every day | ORAL | 1 refills | Status: DC

## 2020-07-10 NOTE — Progress Notes (Signed)
[facility]  Patient ID: Tristan Mason,  MRN: [id number]  PCP: Chales Salmon, MD  Date of Evaluation: 07/10/2020 Time spent:25 minutes from 1015 to 1040  Chief Complaint:  Chief Complaint    Depression; ADHD; Anxiety      HISTORY/CURRENT STATUS: [name] is seen onsite in office 25 minutes face-to-face individually arriving to office 15 minutes late with consent with epic collateral for psychiatric interview and exam in 16-month evaluation and management of recurrent major depression in partial remission, ADHD/OCD, learning variances, and mild cannabis use disorder quantity and frequency reduced 75% from previously.  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  He has therefore been taking time off of school including not registration for his senior year yet at [employer].  He does expect to complete his senior year and then possibly masters and PhD but is currently most interested for the last 6 weeks in [place]. [place].  He is just back from there 10 days ago stating he has a better relationship with his girlfriend Jess than ever, though she has become suicidal and depressive in her behavior since December when her friend completed suicide.  Patient is now careful not to leave some self found her does not consider suicidal any longer.  He ran out of his Lexapro in [place] but did get a 30-day supply from a local pharmacist having no store changes there.  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  He still uses tobacco as  cigarettes and vaping.  He reviews previous use of Ambien for sleep without success while he concludes he needs higher doses or alternative to get to sleep or to get relief of an anxiety attack.  He needs to continue the Lexapro daily finding reducing his dose to 10 mg was not sufficient.  He needs a lower dose of Vyvanse and is off of it currently for months with last dispensing 04/03/2020 per [place] registry and last Valium dispensing 12/27/2019. He has no mania, suicidality, psychosis or delirium.  Depression This is a recurrentproblem with lastepisode startingmore than 33month ago. The onset quality is sudden. The problem occurs intermittently.The problemhas gradually improved to partial remission. Associated symptoms includelow motivation, diminished energy,decreased concentration, obsessive thoughts and compulsive routines,andinsomnia. Associated symptoms include no helplessness,no hopelessness,no decreased interest,no sadness, no appetite change,no headaches,no indigestionand no suicidal ideas.The symptoms are aggravated by work stress, social issues and family issues.Past treatments include SSRIs - Selective serotonin reuptake inhibitors, SNRIs - Serotonin and norepinephrine reuptake inhibitors, psychotherapy and other medications.Compliance with treatment is variable and good.Past compliance problems include difficulty with treatment plan and medication issues.Previous treatment provided moderaterelief.Risk factors include a change in medication usage/dosage, family history, family history of mental illness, history of mental illness, history of self-injury, history of suicide attempt, illicit drug use, prior psychiatric admission, major life event and stress. Past  medical history includes anxiety,bipolar  disorder,depression,mental health disorder,obsessive-compulsive disorderand suicide attempts. Pertinent negatives include no life-threatening condition,no physical disability,no recent psychiatric admission,no eating disorder,no post-traumatic stress disorder,no schizophreniaand no head trauma.  Individual Medical History/ Review of Systems: Changes? :Yes Weight is up 10 pounds in the last 6 months though he has been healthy other than smoking more nicotine and cannabis though still both are relatively less than in past problems.  Allergies: Patient has no known allergies.  Current Medications:  Current Outpatient Medications:  .  benzonatate (TESSALON) 200 MG capsule, Take 1 capsule (200 mg total) by mouth every 8 (eight) hours as needed for cough., Disp: 20 capsule, Rfl: 0 .  escitalopram (LEXAPRO) 20 MG tablet, Take 1 tablet (20 mg total) by mouth at bedtime., Disp: 90 tablet, Rfl: 3 .  lisdexamfetamine (VYVANSE) 20 MG capsule, Take 1 capsule (20 mg total) by mouth daily after breakfast., Disp: 90 capsule, Rfl: 0 .  lisdexamfetamine (VYVANSE) 30 MG capsule, Take 1 capsule (30 mg total) by mouth daily after breakfast., Disp: 30 capsule, Rfl: 0 .  lisdexamfetamine (VYVANSE) 30 MG capsule, Take 1 capsule (30 mg total) by mouth daily after breakfast., Disp: 30 capsule, Rfl: 0 .  zolpidem (AMBIEN) 10 MG tablet, Take 1 tablet (10 mg total) by mouth at bedtime., Disp: 90 tablet, Rfl: 1  Medication Side Effects: none  Family Medical/ Social History: Changes? No  MENTAL HEALTH EXAM:  Height 5\' 9"  (1.753 m), weight 163 lb (73.9 kg).Body mass index is 24.07 kg/m. Muscle strengths and tone 5/5, postural reflexes and gait 0/0, and AIMS = 0.  General Appearance: Casual, Fairly Groomed and Meticulous  Eye Contact:  Good  Speech:  Clear and Coherent, Normal Rate and Talkative  Volume:  Normal  Mood:  Anxious, Dysphoric and Euthymic  Affect:  Congruent, Inappropriate, Restricted and  Anxious  Thought Process:  Coherent, Goal Directed, Linear and Descriptions of Associations: Circumstantial  Orientation:  Full (Time, Place, and Person)  Thought Content: Obsessions and Rumination   Suicidal Thoughts:  No  Homicidal Thoughts:  No  Memory:  Immediate;   Good Remote;   Good  Judgement:  Good  Insight:  Fair  Psychomotor Activity:  Normal and Mannerisms  Concentration:  Concentration: Good and Attention Span: Fair  Recall:  Good  Fund of Knowledge: Good  Language: Good  Assets:  Communication Skills Intimacy Physical Health Talents/Skills Vocational/Educational  ADL's:  Intact  Cognition: WNL  Prognosis:  Good    DIAGNOSES:    ICD-10-CM   1. Recurrent major depression in partial remission (HCC)  F33.41 escitalopram (LEXAPRO) 20 MG tablet  2. Mixed obsessional thoughts and acts  F42.2 escitalopram (LEXAPRO) 20 MG tablet    zolpidem (AMBIEN) 10 MG tablet  3. Attention deficit hyperactivity disorder (ADHD), combined type, moderate  F90.2 lisdexamfetamine (VYVANSE) 20 MG capsule  4. Specific learning disorder, with impairment in reading, mild  F81.0   5. Auditory processing disorder  H93.25     Receiving Psychotherapy: No    RECOMMENDATIONS: With cannabis use down 75% and no increase in smoking otherwise, patient has gained 10 pounds and is physically and mentally healthy.  While formulates interim employment the midst of his  education when [place] have been so detrimental at [employer], he reviews adjustments in his medication to about his medication while residing in the [employer].  Valium is discontinued and Ambien is E scribed 10 mg every bedtime as #90 with 1 refill to [place] on [address] for OCD and depressive insomnia.  Vyvanse is reduced from 30 to 20 mg capsule every morning sent as #90 with no refill to [facility] on [address] for ADHD.  He is E scribed Lexapro 20 mg tablet every bedtime as #90 with 3  refills to [facility] on [address] for OCD and depression.  He returns for follow-up in 6 months or sooner if needed.  Chauncey Mann, MD

## 2020-07-12 ENCOUNTER — Telehealth: Payer: Self-pay | Admitting: Psychiatry

## 2020-07-12 NOTE — Telephone Encounter (Signed)
Pt trying to get his [employer] License but will need a letter stating that he is mentally stable. Pt asked that you call him to go over more details. Please fax letter to [phone].

## 2020-07-13 DIAGNOSIS — Z0289 Encounter for other administrative examinations: Secondary | ICD-10-CM

## 2020-07-13 NOTE — Telephone Encounter (Signed)
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

## 2020-10-11 ENCOUNTER — Encounter: Payer: Self-pay | Admitting: Psychiatry

## 2020-12-25 ENCOUNTER — Ambulatory Visit: Payer: BC Managed Care – PPO | Admitting: Psychiatry

## 2020-12-26 ENCOUNTER — Telehealth: Payer: Self-pay | Admitting: Psychiatry

## 2020-12-26 DIAGNOSIS — F422 Mixed obsessional thoughts and acts: Secondary | ICD-10-CM

## 2020-12-26 DIAGNOSIS — F3341 Major depressive disorder, recurrent, in partial remission: Secondary | ICD-10-CM

## 2020-12-26 DIAGNOSIS — F902 Attention-deficit hyperactivity disorder, combined type: Secondary | ICD-10-CM

## 2020-12-26 MED ORDER — DIAZEPAM 10 MG PO TABS: 10.0000 mg | ORAL_TABLET | Freq: Every day | ORAL | 0 refills | Status: DC

## 2020-12-26 MED ORDER — ESCITALOPRAM OXALATE 20 MG PO TABS: 20.0000 mg | ORAL_TABLET | Freq: Every day | ORAL | 0 refills | Status: DC

## 2020-12-26 MED ORDER — LISDEXAMFETAMINE DIMESYLATE 20 MG PO CAPS: 20.0000 mg | ORAL_CAPSULE | Freq: Every day | ORAL | 0 refills | Status: DC

## 2020-12-26 NOTE — Telephone Encounter (Signed)
[place] registry dispensing in July after last appointment was for #90 as patient to be out of the country now back setting up appointment end of January with my current retirement to see Yvette Rack, DNP needing month supply of Valium 10 mg in place of previous Ambien, Vyvanse 20 mg, and Lexapro 20 mg with no contraindication in the last year.

## 2020-12-26 NOTE — Telephone Encounter (Signed)
Pt would like to get refills until can be seen by [facility] 1/28. Vyvanse, Lexapro and Valium 10mg  ( he does not want Ambien again.  [facility] [address], [place].

## 2020-12-27 ENCOUNTER — Ambulatory Visit: Payer: BC Managed Care – PPO | Admitting: Psychiatry

## 2020-12-28 ENCOUNTER — Telehealth: Payer: Self-pay | Admitting: Psychiatry

## 2020-12-28 DIAGNOSIS — F902 Attention-deficit hyperactivity disorder, combined type: Secondary | ICD-10-CM

## 2020-12-28 MED ORDER — LISDEXAMFETAMINE DIMESYLATE 30 MG PO CAPS: 30.0000 mg | ORAL_CAPSULE | Freq: Every day | ORAL | 0 refills | Status: DC

## 2020-12-28 NOTE — Telephone Encounter (Signed)
Pt now needs PA for Vyvanse. Has new insurance plan with [facility]-  Subscriber #: [id number], RXBIN #: [id number], RX group#: [id number]. Can we please try to work on this PA to [facility]?

## 2020-12-28 NOTE — Telephone Encounter (Signed)
Tristan Mason called to report that you sent in a refill for his Vyvanse, but you sent in 20mg .  He is going back to school and needs the dose to go back up to 30mg .  Please send in a corrected prescription for 30mg .  Has appt w/ [name] 01/19/21.  Send to in [place]

## 2020-12-28 NOTE — Telephone Encounter (Signed)
Patient curiously suggests to reception that he is returning to college and had expected the 30 mg Vyvanse instead of 20 mg sent on January 4 when he made no mention of any such change.  A month supply of Vyvanse 30 mg is sent to [facility] with no contraindication per [place] registry in the last year.

## 2020-12-28 NOTE — Telephone Encounter (Signed)
Noted will get that submitted.

## 2021-01-01 ENCOUNTER — Telehealth: Payer: Self-pay | Admitting: Psychiatry

## 2021-01-01 DIAGNOSIS — F902 Attention-deficit hyperactivity disorder, combined type: Secondary | ICD-10-CM

## 2021-01-01 MED ORDER — AMPHETAMINE-DEXTROAMPHETAMINE 30 MG PO TABS: 30.0000 mg | ORAL_TABLET | Freq: Every day | ORAL | 0 refills | Status: DC

## 2021-01-01 NOTE — Telephone Encounter (Signed)
Pt's mom called and said that they know his vyvanse will not be approved from the PA and he would like to switch to adderall 30 mg and have [facility] [place].

## 2021-01-10 ENCOUNTER — Telehealth: Payer: Self-pay

## 2021-01-10 NOTE — Telephone Encounter (Signed)
Since I don't see any phone messages I did not realize the Mom called to request a change in his medication to Adderall. I did an appeal for his Vyvanse 30 mg and it was approved effective 01/01/2021-12/31/2021 with BC[employer] PA# [id number]

## 2021-01-10 NOTE — Telephone Encounter (Signed)
Noted  

## 2021-01-19 ENCOUNTER — Telehealth (INDEPENDENT_AMBULATORY_CARE_PROVIDER_SITE_OTHER): Payer: BC Managed Care – PPO | Admitting: Adult Health

## 2021-01-19 ENCOUNTER — Encounter: Payer: Self-pay | Admitting: Adult Health

## 2021-01-19 DIAGNOSIS — F902 Attention-deficit hyperactivity disorder, combined type: Secondary | ICD-10-CM | POA: Diagnosis not present

## 2021-01-19 DIAGNOSIS — F3341 Major depressive disorder, recurrent, in partial remission: Secondary | ICD-10-CM

## 2021-01-19 DIAGNOSIS — G4700 Insomnia, unspecified: Secondary | ICD-10-CM | POA: Diagnosis not present

## 2021-01-19 DIAGNOSIS — F422 Mixed obsessional thoughts and acts: Secondary | ICD-10-CM

## 2021-01-19 MED ORDER — DIAZEPAM 10 MG PO TABS: 10.0000 mg | ORAL_TABLET | Freq: Every day | ORAL | 2 refills | Status: DC

## 2021-01-19 MED ORDER — ESCITALOPRAM OXALATE 20 MG PO TABS
20.0000 mg | ORAL_TABLET | Freq: Every day | ORAL | 2 refills | Status: DC
Start: 2021-01-19 — End: 2021-05-10

## 2021-01-19 MED ORDER — AMPHETAMINE-DEXTROAMPHET ER 30 MG PO CP24: 30.0000 mg | ORAL_CAPSULE | Freq: Every day | ORAL | 0 refills | Status: DC

## 2021-01-19 NOTE — Progress Notes (Signed)
Tristan Mason [id number] 09/19/1996 24 y.o.  Virtual Visit via Video Note  I connected with pt @ on 01/19/21 at 11:40 AM EST by a video enabled telemedicine application and verified that I am speaking with the correct person using two identifiers.   I discussed the limitations of evaluation and management by telemedicine and the availability of in person appointments. The patient expressed understanding and agreed to proceed.  I discussed the assessment and treatment plan with the patient. The patient was provided an opportunity to ask questions and all were answered. The patient agreed with the plan and demonstrated an understanding of the instructions.   The patient was advised to call back or seek an in-person evaluation if the symptoms worsen or if the condition fails to improve as anticipated.  I provided 30 minutes of non-face-to-face time during this encounter.  The patient was located at home.  The provider was located at [facility].   Dorothyann Gibbs, NP   Subjective:   Patient ID:  Tristan Mason is a 25 y.o. (DOB [birth year]) male.  Chief Complaint: No chief complaint on file.   HPI NIKKOLAS COOMES presents for follow-up of MDD, ADHD, insomnia, and mixed obsessional thoughts and acts.  Describes mood today as "ok". Pleasant. Mood symptoms - reports some depression, anxiety, and irritability. Struggles with existenalism. Feels "melancholy". Stating "I'm not black or white, but a dark gray". Struggles with empathy and relating to others. Also stating "I'm getting through things". Is not satisfied with current prescription of Adderall. Feels like the instant release "hits hard and then I crash". Is will to try the extended version of Adderall over next 4 week. Continues to take Lexapro at 20mg  and feels it continues to help. Using Valium 10mg  to sleep at night. Stable interest and motivation. Taking medications as prescribed.  Energy levels stable. Active, does not  have a regular exercise routine.   Enjoys some usual interests and activities. Student at .   Appetite adequate. Weight stable. Sleeps well most nights. Averages 6 to 7 hours. Early morning classes. Focus and concentration stable. Completing tasks. Managing aspects of household. Senior at . Denies SI or HI.  Denies AH or VH.  Previous medication trials: Vyvanse, Adderall IR,    Review of Systems:  Review of Systems  Musculoskeletal: Negative for gait problem.  Neurological: Negative for tremors.  Psychiatric/Behavioral:       Please refer to HPI    Medications: I have reviewed the patient's current medications.  Current Outpatient Medications  Medication Sig Dispense Refill  . amphetamine-dextroamphetamine (ADDERALL XR) 30 MG 24 hr capsule Take 1 capsule (30 mg total) by mouth daily. 30 capsule 0  . benzonatate (TESSALON) 200 MG capsule Take 1 capsule (200 mg total) by mouth every 8 (eight) hours as needed for cough. 20 capsule 0  . diazepam (VALIUM) 10 MG tablet Take 1 tablet (10 mg total) by mouth at bedtime. 30 tablet 2  . escitalopram (LEXAPRO) 20 MG tablet Take 1 tablet (20 mg total) by mouth at bedtime. 30 tablet 2   No current facility-administered medications for this visit.    Medication Side Effects: None  Allergies: No Known Allergies  Past Medical History:  Diagnosis Date  . ADHD (attention deficit hyperactivity disorder)   . Dyslexia   . ODD (oppositional defiant disorder)     No family history on file.  Social History   Socioeconomic History  . Marital status: Single    Spouse name: Not  on file  . Number of children: Not on file  . Years of education: Not on file  . Highest education level: Not on file  Occupational History  . Not on file  Tobacco Use  . Smoking status: Current Some Day Smoker    Types: Cigarettes  . Smokeless tobacco: Former Clinical biochemist  . Vaping Use: Every day  Substance and Sexual Activity  . Alcohol use: No   . Drug use: Yes    Types: Marijuana    Comment: 3 times weekly only on weekend and after 1800 expecting less when school in session  . Sexual activity: Yes    Partners: Female  Other Topics Concern  . Not on file  Social History Narrative  . Not on file   Social Determinants of Health   Financial Resource Strain: Not on file  Food Insecurity: Not on file  Transportation Needs: Not on file  Physical Activity: Not on file  Stress: Not on file  Social Connections: Not on file  Intimate Partner Violence: Not on file    Past Medical History, Surgical history, Social history, and Family history were reviewed and updated as appropriate.   Please see review of systems for further details on the patient's review from today.   Objective:   Physical Exam:  There were no vitals taken for this visit.  Physical Exam Constitutional:      General: He is not in acute distress. Musculoskeletal:        General: No deformity.  Neurological:     Mental Status: He is alert and oriented to person, place, and time.     Coordination: Coordination normal.  Psychiatric:        Attention and Perception: Attention and perception normal. He does not perceive auditory or visual hallucinations.        Mood and Affect: Mood normal. Mood is not anxious or depressed. Affect is not labile, blunt, angry or inappropriate.        Speech: Speech normal.        Behavior: Behavior normal.        Thought Content: Thought content normal. Thought content is not paranoid or delusional. Thought content does not include homicidal or suicidal ideation. Thought content does not include homicidal or suicidal plan.        Cognition and Memory: Cognition and memory normal.        Judgment: Judgment normal.     Comments: Insight intact     Lab Review:     Component Value Date/Time   NA 137 02/01/2015 0639   K 3.8 02/01/2015 0639   CL 103 02/01/2015 0639   CO2 28 02/01/2015 0639   GLUCOSE 86 02/01/2015 0639    BUN 16 02/01/2015 0639   CREATININE 0.82 02/01/2015 0639   CALCIUM 9.2 02/01/2015 0639   PROT 7.2 02/01/2015 0639   ALBUMIN 4.6 02/01/2015 0639   AST 17 02/01/2015 0639   ALT 13 02/01/2015 0639   ALKPHOS 64 02/01/2015 0639   BILITOT 1.3 (H) 02/01/2015 0639   GFRNONAA NOT CALCULATED 02/01/2015 0639   GFRAA NOT CALCULATED 02/01/2015 0639       Component Value Date/Time   WBC 6.7 02/01/2015 0639   RBC 4.70 02/01/2015 0639   HGB 14.6 02/01/2015 0639   HCT 42.3 02/01/2015 0639   PLT 222 02/01/2015 0639   MCV 90.0 02/01/2015 0639   MCH 31.1 02/01/2015 0639   MCHC 34.5 02/01/2015 0639   RDW 12.5 02/01/2015 7425  LYMPHSABS 1.5 05/20/2014 1645   MONOABS 1.0 05/20/2014 1645   EOSABS 0.1 05/20/2014 1645   BASOSABS 0.0 05/20/2014 1645    No results found for: POCLITH, LITHIUM   No results found for: PHENYTOIN, PHENOBARB, VALPROATE, CBMZ   .res Assessment: Plan:    Plan:  PDMP reviewed  1. Continue Lexapro 20mg  daily 2. D/C Ambien 10mg  at bedtime 3. Decrease Vyvanse 20mg  daily 4. Continue Valium 10mg  at bedtime  Read and reviewed note with patient for accuracy.   RTC 4 weeks  Patient advised to contact office with any questions, adverse effects, or acute worsening in signs and symptoms.  Discussed potential benefits, risks, and side effects of stimulants with patient to include increased heart rate, palpitations, insomnia, increased anxiety, increased irritability, or decreased appetite.  Instructed patient to contact office if experiencing any significant tolerability issues.  Discussed potential benefits, risk, and side effects of benzodiazepines to include potential risk of tolerance and dependence, as well as possible drowsiness.  Advised patient not to drive if experiencing drowsiness and to take lowest possible effective dose to minimize risk of dependence and tolerance.     Diagnoses and all orders for this visit:  Attention deficit hyperactivity disorder  (ADHD), combined type, moderate -     amphetamine-dextroamphetamine (ADDERALL XR) 30 MG 24 hr capsule; Take 1 capsule (30 mg total) by mouth daily.  Recurrent major depression in partial remission (HCC) -     escitalopram (LEXAPRO) 20 MG tablet; Take 1 tablet (20 mg total) by mouth at bedtime. -     diazepam (VALIUM) 10 MG tablet; Take 1 tablet (10 mg total) by mouth at bedtime.  Mixed obsessional thoughts and acts -     escitalopram (LEXAPRO) 20 MG tablet; Take 1 tablet (20 mg total) by mouth at bedtime. -     diazepam (VALIUM) 10 MG tablet; Take 1 tablet (10 mg total) by mouth at bedtime.  Insomnia, unspecified type -     diazepam (VALIUM) 10 MG tablet; Take 1 tablet (10 mg total) by mouth at bedtime.     Please see After Visit Summary for patient specific instructions.  No future appointments.  No orders of the defined types were placed in this encounter.     -------------------------------

## 2021-02-21 ENCOUNTER — Other Ambulatory Visit: Payer: Self-pay | Admitting: Adult Health

## 2021-02-21 ENCOUNTER — Telehealth: Payer: Self-pay | Admitting: Adult Health

## 2021-02-21 DIAGNOSIS — F902 Attention-deficit hyperactivity disorder, combined type: Secondary | ICD-10-CM

## 2021-02-21 MED ORDER — AMPHETAMINE-DEXTROAMPHET ER 30 MG PO CP24: 30.0000 mg | ORAL_CAPSULE | Freq: Every day | ORAL | 0 refills | Status: DC

## 2021-02-21 NOTE — Telephone Encounter (Signed)
He does not have any other symptoms beside the extreme sleep.He said he is fine but wants to make sure adderall xr can be sent in today because he has a test in the morning.He goes to school in [place] so he can't come to the office tomorrow but can do a telephone or virtual visit if possible.

## 2021-02-21 NOTE — Telephone Encounter (Signed)
Pt wants to know can he increase his Lexapro dose and decrease the Adderall XR.He reports severe depression and said he does not need his Valium because all he does is sleep.He says no matter what he tries to stay awake it does not work because his depression is causing him to sleep.He is out of the Adderall completely and would like a decreased dose sent.Pt also wants to discuss an alternative to Lexapro due to it not being strong enough to treat his depression.

## 2021-02-21 NOTE — Telephone Encounter (Signed)
Lets call and get him in the office at 2:00 tomorrow. Confirm safety - any SI or HI?Tristan Mason

## 2021-02-21 NOTE — Telephone Encounter (Signed)
Please schedule pt for a my chart visit with Almira Coaster tomorrow at 2pm

## 2021-02-21 NOTE — Telephone Encounter (Signed)
Tristan Mason called and LM to he was having a crisis and needed to talk with the Dr. About his situation and medication. He is disparately wanting a call back today.  He did not leave a number so the main # in his chart is [phone]

## 2021-02-21 NOTE — Telephone Encounter (Signed)
Noted  

## 2021-02-21 NOTE — Telephone Encounter (Signed)
Script sent for Adderall. We can do a virtual appointment.

## 2021-02-21 NOTE — Telephone Encounter (Signed)
Can we call and get more details. 

## 2021-02-21 NOTE — Telephone Encounter (Signed)
There is not a 2pm available on the schedule now. I called pt about an earlier time but his voice mail is not set up.

## 2021-04-13 ENCOUNTER — Other Ambulatory Visit: Payer: Self-pay | Admitting: Adult Health

## 2021-04-13 ENCOUNTER — Telehealth: Payer: Self-pay | Admitting: Adult Health

## 2021-04-13 DIAGNOSIS — F902 Attention-deficit hyperactivity disorder, combined type: Secondary | ICD-10-CM

## 2021-04-13 MED ORDER — AMPHETAMINE-DEXTROAMPHET ER 30 MG PO CP24: 30.0000 mg | ORAL_CAPSULE | Freq: Every day | ORAL | 0 refills | Status: DC

## 2021-04-13 MED ORDER — AMPHETAMINE-DEXTROAMPHET ER 30 MG PO CP24
30.0000 mg | ORAL_CAPSULE | Freq: Every day | ORAL | 0 refills | Status: DC
Start: 2021-04-13 — End: 2021-05-10

## 2021-04-13 NOTE — Telephone Encounter (Signed)
Script sent  

## 2021-04-13 NOTE — Telephone Encounter (Signed)
[facility]  In [place] [facility] not [facility] Adder[facility] XR in stock. Can [facility] :  [facility] [address] [place] [address] Pt checked and they have it in stock.

## 2021-04-13 NOTE — Telephone Encounter (Signed)
Pt requesting refill for Adderall  XR 30 mg [facility] [address] Vivia Budge. Final Exams. Will schedule apt for Mid May to come in office .After school is out.

## 2021-05-03 ENCOUNTER — Ambulatory Visit: Payer: BC Managed Care – PPO | Admitting: Adult Health

## 2021-05-10 ENCOUNTER — Ambulatory Visit (INDEPENDENT_AMBULATORY_CARE_PROVIDER_SITE_OTHER): Payer: BC Managed Care – PPO | Admitting: Adult Health

## 2021-05-10 ENCOUNTER — Encounter: Payer: Self-pay | Admitting: Adult Health

## 2021-05-10 ENCOUNTER — Other Ambulatory Visit: Payer: Self-pay

## 2021-05-10 DIAGNOSIS — G4700 Insomnia, unspecified: Secondary | ICD-10-CM | POA: Diagnosis not present

## 2021-05-10 DIAGNOSIS — F902 Attention-deficit hyperactivity disorder, combined type: Secondary | ICD-10-CM

## 2021-05-10 DIAGNOSIS — F422 Mixed obsessional thoughts and acts: Secondary | ICD-10-CM | POA: Diagnosis not present

## 2021-05-10 DIAGNOSIS — F3341 Major depressive disorder, recurrent, in partial remission: Secondary | ICD-10-CM

## 2021-05-10 MED ORDER — ESCITALOPRAM OXALATE 20 MG PO TABS: 20.0000 mg | ORAL_TABLET | Freq: Every day | ORAL | 5 refills | Status: DC

## 2021-05-10 MED ORDER — DIAZEPAM 10 MG PO TABS: 10.0000 mg | ORAL_TABLET | Freq: Every day | ORAL | 2 refills | Status: DC

## 2021-05-10 MED ORDER — AMPHETAMINE-DEXTROAMPHETAMINE 20 MG PO TABS: 20.0000 mg | ORAL_TABLET | Freq: Every day | ORAL | 0 refills | Status: DC

## 2021-05-10 MED ORDER — AMPHETAMINE-DEXTROAMPHET ER 30 MG PO CP24: 30.0000 mg | ORAL_CAPSULE | Freq: Every day | ORAL | 0 refills | Status: DC

## 2021-05-10 NOTE — Progress Notes (Signed)
Tristan Mason [id number] 04-Sep-1996 25 y.o.  Subjective:   Patient ID:  Tristan Mason is a 25 y.o. (DOB 1996-09-01) male.  Chief Complaint: No chief complaint on file.   HPI ANDREA COLGLAZIER presents to the office today for follow-up of MDD, ADHD, insomnia, and mixed obsessional thoughts and acts.  Describes mood today as "ok". Pleasant. Mood symptoms - reports some depression, anxiety, and irritability. Stating "I'm doing better". Finished current semester at [employer]. Home visiting family for the weekend and is returning to [employer] for summer classes. Feels like he is recovering from break up - "it was difficult for a while". Has started to talking to someone new in similar academic setting. Reports Adderall XR 30mg  not lasting through his school day. Struggles in the evenings to complete assignments and would like to add a booster of the Adderall IR for evenings he is studying late. Continues to take Lexapro at 20mg  for mood control and Valium 10mg  to sleep at night. Stable interest and motivation. Taking medications as prescribed.  Energy levels stable. Active, does not have a regular exercise routine.   Enjoys some usual interests and activities. Student at .   Appetite adequate. Weight stable. Sleeps well most nights. Averages 6 to 7 hours.   Focus and concentration stable. Completing tasks. Managing aspects of household. Senior at . Denies SI or HI.  Denies AH or VH.  Previous medication trials: Vyvanse, Adderall IR,    Review of Systems:  Review of Systems  Musculoskeletal: Negative for gait problem.  Neurological: Negative for tremors.  Psychiatric/Behavioral:       Please refer to HPI    Medications: I have reviewed the patient's current medications.  Current Outpatient Medications  Medication Sig Dispense Refill  . amphetamine-dextroamphetamine (ADDERALL) 20 MG tablet Take 1 tablet (20 mg total) by mouth daily. 30 tablet 0  . amphetamine-dextroamphetamine  (ADDERALL XR) 30 MG 24 hr capsule Take 1 capsule (30 mg total) by mouth daily. 30 capsule 0  . diazepam (VALIUM) 10 MG tablet Take 1 tablet (10 mg total) by mouth at bedtime. 30 tablet 2  . escitalopram (LEXAPRO) 20 MG tablet Take 1 tablet (20 mg total) by mouth at bedtime. 30 tablet 5   No current facility-administered medications for this visit.    Medication Side Effects: None  Allergies: No Known Allergies  Past Medical History:  Diagnosis Date  . ADHD (attention deficit hyperactivity disorder)   . Dyslexia   . ODD (oppositional defiant disorder)     Past Medical History, Surgical history, Social history, and Family history were reviewed and updated as appropriate.   Please see review of systems for further details on the patient's review from today.   Objective:   Physical Exam:  There were no vitals taken for this visit.  Physical Exam Constitutional:      General: He is not in acute distress. Musculoskeletal:        General: No deformity.  Neurological:     Mental Status: He is alert and oriented to person, place, and time.     Coordination: Coordination normal.  Psychiatric:        Attention and Perception: Attention and perception normal. He does not perceive auditory or visual hallucinations.        Mood and Affect: Mood normal. Mood is not anxious or depressed. Affect is not labile, blunt, angry or inappropriate.        Speech: Speech normal.        Behavior:  Behavior normal.        Thought Content: Thought content normal. Thought content is not paranoid or delusional. Thought content does not include homicidal or suicidal ideation. Thought content does not include homicidal or suicidal plan.        Cognition and Memory: Cognition and memory normal.        Judgment: Judgment normal.     Comments: Insight intact     Lab Review:     Component Value Date/Time   NA 137 02/01/2015 0639   K 3.8 02/01/2015 0639   CL 103 02/01/2015 0639   CO2 28 02/01/2015 0639    GLUCOSE 86 02/01/2015 0639   BUN 16 02/01/2015 0639   CREATININE 0.82 02/01/2015 0639   CALCIUM 9.2 02/01/2015 0639   PROT 7.2 02/01/2015 0639   ALBUMIN 4.6 02/01/2015 0639   AST 17 02/01/2015 0639   ALT 13 02/01/2015 0639   ALKPHOS 64 02/01/2015 0639   BILITOT 1.3 (H) 02/01/2015 0639   GFRNONAA NOT CALCULATED 02/01/2015 0639   GFRAA NOT CALCULATED 02/01/2015 0639       Component Value Date/Time   WBC 6.7 02/01/2015 0639   RBC 4.70 02/01/2015 0639   HGB 14.6 02/01/2015 0639   HCT 42.3 02/01/2015 0639   PLT 222 02/01/2015 0639   MCV 90.0 02/01/2015 0639   MCH 31.1 02/01/2015 0639   MCHC 34.5 02/01/2015 0639   RDW 12.5 02/01/2015 0639   LYMPHSABS 1.5 05/20/2014 1645   MONOABS 1.0 05/20/2014 1645   EOSABS 0.1 05/20/2014 1645   BASOSABS 0.0 05/20/2014 1645    No results found for: POCLITH, LITHIUM   No results found for: PHENYTOIN, PHENOBARB, VALPROATE, CBMZ   .res Assessment: Plan:    Plan:  PDMP reviewed  1. Continue Lexapro 20mg  daily 2. Continue Adderall XR 30mg  daily 3. Add Adderall 20mg  in the afternoon.  4. Continue Valium 10mg  at bedtime  Read and reviewed note with patient for accuracy.   RTC 3 months  Patient advised to contact office with any questions, adverse effects, or acute worsening in signs and symptoms.  Discussed potential benefits, risks, and side effects of stimulants with patient to include increased heart rate, palpitations, insomnia, increased anxiety, increased irritability, or decreased appetite.  Instructed patient to contact office if experiencing any significant tolerability issues.  Discussed potential benefits, risk, and side effects of benzodiazepines to include potential risk of tolerance and dependence, as well as possible drowsiness.  Advised patient not to drive if experiencing drowsiness and to take lowest possible effective dose to minimize risk of dependence and tolerance.   Diagnoses and all orders for this  visit:  Attention deficit hyperactivity disorder (ADHD), combined type, moderate -     amphetamine-dextroamphetamine (ADDERALL XR) 30 MG 24 hr capsule; Take 1 capsule (30 mg total) by mouth daily. -     amphetamine-dextroamphetamine (ADDERALL) 20 MG tablet; Take 1 tablet (20 mg total) by mouth daily.  Recurrent major depression in partial remission (HCC) -     diazepam (VALIUM) 10 MG tablet; Take 1 tablet (10 mg total) by mouth at bedtime. -     escitalopram (LEXAPRO) 20 MG tablet; Take 1 tablet (20 mg total) by mouth at bedtime.  Mixed obsessional thoughts and acts -     diazepam (VALIUM) 10 MG tablet; Take 1 tablet (10 mg total) by mouth at bedtime. -     escitalopram (LEXAPRO) 20 MG tablet; Take 1 tablet (20 mg total) by mouth at bedtime.  Insomnia, unspecified  type -     diazepam (VALIUM) 10 MG tablet; Take 1 tablet (10 mg total) by mouth at bedtime.     Please see After Visit Summary for patient specific instructions.  No future appointments.  No orders of the defined types were placed in this encounter.   -------------------------------

## 2021-06-14 ENCOUNTER — Telehealth: Payer: Self-pay | Admitting: Adult Health

## 2021-06-14 ENCOUNTER — Other Ambulatory Visit: Payer: Self-pay

## 2021-06-14 ENCOUNTER — Other Ambulatory Visit: Payer: Self-pay | Admitting: Adult Health

## 2021-06-14 DIAGNOSIS — F902 Attention-deficit hyperactivity disorder, combined type: Secondary | ICD-10-CM

## 2021-06-14 MED ORDER — AMPHETAMINE-DEXTROAMPHETAMINE 20 MG PO TABS: 20.0000 mg | ORAL_TABLET | Freq: Every day | ORAL | 0 refills | Status: DC

## 2021-06-14 MED ORDER — AMPHETAMINE-DEXTROAMPHET ER 30 MG PO CP24: 30.0000 mg | ORAL_CAPSULE | Freq: Every day | ORAL | 0 refills | Status: DC

## 2021-06-14 NOTE — Telephone Encounter (Signed)
Pt called requesting Adderall XR & Adderall regular release and Diazepam refill @ [facility] [address]. [address]

## 2021-06-14 NOTE — Telephone Encounter (Signed)
Script sent  

## 2021-06-14 NOTE — Telephone Encounter (Signed)
Should have refills on valium. Ok to pend Adderall.

## 2021-06-14 NOTE — Telephone Encounter (Signed)
Pended.

## 2021-08-06 ENCOUNTER — Telehealth: Payer: Self-pay | Admitting: Adult Health

## 2021-08-06 NOTE — Telephone Encounter (Signed)
Pt requesting refill for adderall 30 & 20 mg

## 2021-08-07 ENCOUNTER — Other Ambulatory Visit: Payer: Self-pay

## 2021-08-07 DIAGNOSIS — F902 Attention-deficit hyperactivity disorder, combined type: Secondary | ICD-10-CM

## 2021-08-07 MED ORDER — AMPHETAMINE-DEXTROAMPHETAMINE 20 MG PO TABS: 20.0000 mg | ORAL_TABLET | Freq: Every day | ORAL | 0 refills | Status: DC

## 2021-08-07 MED ORDER — AMPHETAMINE-DEXTROAMPHET ER 30 MG PO CP24: 30.0000 mg | ORAL_CAPSULE | Freq: Every day | ORAL | 0 refills | Status: DC

## 2021-08-07 NOTE — Telephone Encounter (Signed)
Last refill 6/23 for both Rx's.  Will pend for [facility] to review and send

## 2021-08-14 ENCOUNTER — Telehealth (INDEPENDENT_AMBULATORY_CARE_PROVIDER_SITE_OTHER): Payer: BC Managed Care – PPO | Admitting: Adult Health

## 2021-08-14 ENCOUNTER — Encounter: Payer: Self-pay | Admitting: Adult Health

## 2021-08-14 DIAGNOSIS — F902 Attention-deficit hyperactivity disorder, combined type: Secondary | ICD-10-CM

## 2021-08-14 DIAGNOSIS — F422 Mixed obsessional thoughts and acts: Secondary | ICD-10-CM | POA: Diagnosis not present

## 2021-08-14 DIAGNOSIS — F3341 Major depressive disorder, recurrent, in partial remission: Secondary | ICD-10-CM

## 2021-08-14 DIAGNOSIS — G4700 Insomnia, unspecified: Secondary | ICD-10-CM

## 2021-08-14 MED ORDER — AMPHETAMINE-DEXTROAMPHETAMINE 20 MG PO TABS: 20.0000 mg | ORAL_TABLET | Freq: Every day | ORAL | 0 refills | Status: DC

## 2021-08-14 MED ORDER — AMPHETAMINE-DEXTROAMPHET ER 30 MG PO CP24: 30.0000 mg | ORAL_CAPSULE | Freq: Every day | ORAL | 0 refills | Status: DC

## 2021-08-14 MED ORDER — DIAZEPAM 10 MG PO TABS: 10.0000 mg | ORAL_TABLET | Freq: Every day | ORAL | 2 refills | Status: DC

## 2021-08-14 MED ORDER — ESCITALOPRAM OXALATE 20 MG PO TABS: 20.0000 mg | ORAL_TABLET | Freq: Every day | ORAL | 5 refills | Status: DC

## 2021-08-14 NOTE — Progress Notes (Signed)
Tristan Mason [id number] 1996-12-22 25 y.o.  Virtual Visit via Telephone Note  I connected with pt on 08/14/21 at  4:20 PM EDT by telephone and verified that I am speaking with the correct person using two identifiers.   I discussed the limitations, risks, security and privacy concerns of performing an evaluation and management service by telephone and the availability of in person appointments. I also discussed with the patient that there may be a patient responsible charge related to this service. The patient expressed understanding and agreed to proceed.   I discussed the assessment and treatment plan with the patient. The patient was provided an opportunity to ask questions and all were answered. The patient agreed with the plan and demonstrated an understanding of the instructions.   The patient was advised to call back or seek an in-person evaluation if the symptoms worsen or if the condition fails to improve as anticipated.  I provided 30 minutes of non-face-to-face time during this encounter.  The patient was located at home.  The provider was located at [facility].   Dorothyann Gibbs, NP   Subjective:   Patient ID:  Tristan Mason is a 25 y.o. (DOB Jul 25, 1996) male.  Chief Complaint: No chief complaint on file.   HPI Tristan Mason presents for follow-up of MDD, ADHD, insomnia, and mixed obsessional thoughts and acts.  Describes mood today as "ok". Pleasant. Mood symptoms - denies depression, anxiety, and irritability. Has explainable "depressive" days. Stating "I'm doing pretty good". In a new relationship - dating for 6 months - recently moved in together. Feels like medications are working well. Would like to restart the Valium for sleep. Stable interest and motivation. Taking medications as prescribed.  Energy levels stable. Active, does not have a regular exercise routine.   Enjoys some usual interests and activities. Dating. In a relationship. Family in  [place]. Appetite adequate. Weight stable - 164 pounds. Sleeps well most nights. Averages 5 to 6 hours.   Focus and concentration stable. Completing tasks. Managing aspects of household. Senior at [employer]. Denies SI or HI.  Denies AH or VH.  Previous medication trials: Vyvanse, Adderall IR,    Review of Systems:  Review of Systems  Musculoskeletal:  Negative for gait problem.  Neurological:  Negative for tremors.  Psychiatric/Behavioral:         Please refer to HPI   Medications: I have reviewed the patient's current medications.  Current Outpatient Medications  Medication Sig Dispense Refill   amphetamine-dextroamphetamine (ADDERALL XR) 30 MG 24 hr capsule Take 1 capsule (30 mg total) by mouth daily. 30 capsule 0   amphetamine-dextroamphetamine (ADDERALL) 20 MG tablet Take 1 tablet (20 mg total) by mouth daily. 30 tablet 0   diazepam (VALIUM) 10 MG tablet Take 1 tablet (10 mg total) by mouth at bedtime. 30 tablet 2   escitalopram (LEXAPRO) 20 MG tablet Take 1 tablet (20 mg total) by mouth at bedtime. 30 tablet 5   No current facility-administered medications for this visit.    Medication Side Effects: None  Allergies: No Known Allergies  Past Medical History:  Diagnosis Date   ADHD (attention deficit hyperactivity disorder)    Dyslexia    ODD (oppositional defiant disorder)     No family history on file.  Social History   Socioeconomic History   Marital status: Single    Spouse name: Not on file   Number of children: Not on file   Years of education: Not on file  Highest education level: Not on file  Occupational History   Not on file  Tobacco Use   Smoking status: Some Days    Types: Cigarettes   Smokeless tobacco: Former  Building services engineer Use: Every day  Substance and Sexual Activity   Alcohol use: No   Drug use: Yes    Types: Marijuana    Comment: 3 times weekly only on weekend and after 1800 expecting less when school in session   Sexual  activity: Yes    Partners: Female  Other Topics Concern   Not on file  Social History Narrative   Not on file   Social Determinants of Health   Financial Resource Strain: Not on file  Food Insecurity: Not on file  Transportation Needs: Not on file  Physical Activity: Not on file  Stress: Not on file  Social Connections: Not on file  Intimate Partner Violence: Not on file    Past Medical History, Surgical history, Social history, and Family history were reviewed and updated as appropriate.   Please see review of systems for further details on the patient's review from today.   Objective:   Physical Exam:  There were no vitals taken for this visit.  Physical Exam Constitutional:      General: He is not in acute distress. Musculoskeletal:        General: No deformity.  Neurological:     Mental Status: He is alert and oriented to person, place, and time.     Coordination: Coordination normal.  Psychiatric:        Attention and Perception: Attention and perception normal. He does not perceive auditory or visual hallucinations.        Mood and Affect: Mood normal. Mood is not anxious or depressed. Affect is not labile, blunt, angry or inappropriate.        Speech: Speech normal.        Behavior: Behavior normal.        Thought Content: Thought content normal. Thought content is not paranoid or delusional. Thought content does not include homicidal or suicidal ideation. Thought content does not include homicidal or suicidal plan.        Cognition and Memory: Cognition and memory normal.        Judgment: Judgment normal.     Comments: Insight intact    Lab Review:     Component Value Date/Time   NA 137 02/01/2015 0639   K 3.8 02/01/2015 0639   CL 103 02/01/2015 0639   CO2 28 02/01/2015 0639   GLUCOSE 86 02/01/2015 0639   BUN 16 02/01/2015 0639   CREATININE 0.82 02/01/2015 0639   CALCIUM 9.2 02/01/2015 0639   PROT 7.2 02/01/2015 0639   ALBUMIN 4.6 02/01/2015 0639    AST 17 02/01/2015 0639   ALT 13 02/01/2015 0639   ALKPHOS 64 02/01/2015 0639   BILITOT 1.3 (H) 02/01/2015 0639   GFRNONAA NOT CALCULATED 02/01/2015 0639   GFRAA NOT CALCULATED 02/01/2015 0639       Component Value Date/Time   WBC 6.7 02/01/2015 0639   RBC 4.70 02/01/2015 0639   HGB 14.6 02/01/2015 0639   HCT 42.3 02/01/2015 0639   PLT 222 02/01/2015 0639   MCV 90.0 02/01/2015 0639   MCH 31.1 02/01/2015 0639   MCHC 34.5 02/01/2015 0639   RDW 12.5 02/01/2015 0639   LYMPHSABS 1.5 05/20/2014 1645   MONOABS 1.0 05/20/2014 1645   EOSABS 0.1 05/20/2014 1645   BASOSABS 0.0 05/20/2014 1645  No results found for: POCLITH, LITHIUM   No results found for: PHENYTOIN, PHENOBARB, VALPROATE, CBMZ   .res Assessment: Plan:    Plan:  PDMP reviewed  1. Lexapro 20mg  daily 2. Adderall XR 30mg  daily 3. Adderall 20mg  in the afternoon 4. Restart Valium 10mg  at bedtime   Consider Vyvanse again when generic  Read and reviewed note with patient for accuracy.   RTC 3 months  Patient advised to contact office with any questions, adverse effects, or acute worsening in signs and symptoms.  Discussed potential benefits, risks, and side effects of stimulants with patient to include increased heart rate, palpitations, insomnia, increased anxiety, increased irritability, or decreased appetite.  Instructed patient to contact office if experiencing any significant tolerability issues.  Discussed potential benefits, risk, and side effects of benzodiazepines to include potential risk of tolerance and dependence, as well as possible drowsiness.  Advised patient not to drive if experiencing drowsiness and to take lowest possible effective dose to minimize risk of dependence and tolerance.  Diagnoses and all orders for this visit:  Attention deficit hyperactivity disorder (ADHD), combined type, moderate -     amphetamine-dextroamphetamine (ADDERALL XR) 30 MG 24 hr capsule; Take 1 capsule (30 mg total)  by mouth daily. -     amphetamine-dextroamphetamine (ADDERALL) 20 MG tablet; Take 1 tablet (20 mg total) by mouth daily.  Recurrent major depression in partial remission (HCC) -     escitalopram (LEXAPRO) 20 MG tablet; Take 1 tablet (20 mg total) by mouth at bedtime. -     diazepam (VALIUM) 10 MG tablet; Take 1 tablet (10 mg total) by mouth at bedtime.  Mixed obsessional thoughts and acts -     escitalopram (LEXAPRO) 20 MG tablet; Take 1 tablet (20 mg total) by mouth at bedtime. -     diazepam (VALIUM) 10 MG tablet; Take 1 tablet (10 mg total) by mouth at bedtime.  Insomnia, unspecified type -     diazepam (VALIUM) 10 MG tablet; Take 1 tablet (10 mg total) by mouth at bedtime.   Please see After Visit Summary for patient specific instructions.  No future appointments.   No orders of the defined types were placed in this encounter.     -------------------------------

## 2021-09-11 ENCOUNTER — Telehealth: Payer: Self-pay | Admitting: Adult Health

## 2021-09-11 NOTE — Telephone Encounter (Signed)
Pt left message stating that the pharmacy doesn't have the adderall xr 30 mg but they have the 20 mg. He also needs adderall [facility] mg to [facility] [place]

## 2021-09-12 ENCOUNTER — Other Ambulatory Visit: Payer: Self-pay

## 2021-09-12 DIAGNOSIS — F902 Attention-deficit hyperactivity disorder, combined type: Secondary | ICD-10-CM

## 2021-09-12 MED ORDER — AMPHETAMINE-DEXTROAMPHETAMINE 20 MG PO TABS: 20.0000 mg | ORAL_TABLET | Freq: Every day | ORAL | 0 refills | Status: DC

## 2021-09-12 NOTE — Telephone Encounter (Signed)
Pended.

## 2021-09-12 NOTE — Telephone Encounter (Signed)
It looks like pt is due for both refills.I will pend the 20 mg but he needs to call back with a different pharmacy for the 30 mg.

## 2021-10-22 ENCOUNTER — Other Ambulatory Visit: Payer: Self-pay

## 2021-10-22 ENCOUNTER — Telehealth: Payer: Self-pay | Admitting: Adult Health

## 2021-10-22 DIAGNOSIS — F422 Mixed obsessional thoughts and acts: Secondary | ICD-10-CM

## 2021-10-22 DIAGNOSIS — F3341 Major depressive disorder, recurrent, in partial remission: Secondary | ICD-10-CM

## 2021-10-22 DIAGNOSIS — F902 Attention-deficit hyperactivity disorder, combined type: Secondary | ICD-10-CM

## 2021-10-22 DIAGNOSIS — G4700 Insomnia, unspecified: Secondary | ICD-10-CM

## 2021-10-22 NOTE — Telephone Encounter (Signed)
Pt needs a refill on both of his adderall and his valium. This needs to be sent to the [facility] [address] in [place]

## 2021-10-22 NOTE — Telephone Encounter (Signed)
Pended.Please schedule appt

## 2021-10-22 NOTE — Telephone Encounter (Signed)
Pt called back to confirm the pharmacy does have the Adderall. ([facility], [address]).

## 2021-10-23 MED ORDER — DIAZEPAM 10 MG PO TABS: 10.0000 mg | ORAL_TABLET | Freq: Every day | ORAL | 0 refills | Status: DC

## 2021-10-23 MED ORDER — AMPHETAMINE-DEXTROAMPHETAMINE 20 MG PO TABS: 20.0000 mg | ORAL_TABLET | Freq: Every day | ORAL | 0 refills | Status: DC

## 2021-10-23 MED ORDER — AMPHETAMINE-DEXTROAMPHET ER 30 MG PO CP24: 30.0000 mg | ORAL_CAPSULE | Freq: Every day | ORAL | 0 refills | Status: DC

## 2021-10-23 NOTE — Telephone Encounter (Signed)
Voicemail is full can't leave a message to schedule ?

## 2021-11-27 ENCOUNTER — Telehealth: Payer: Self-pay | Admitting: Adult Health

## 2021-11-27 ENCOUNTER — Other Ambulatory Visit: Payer: Self-pay

## 2021-11-27 DIAGNOSIS — F3341 Major depressive disorder, recurrent, in partial remission: Secondary | ICD-10-CM

## 2021-11-27 DIAGNOSIS — F902 Attention-deficit hyperactivity disorder, combined type: Secondary | ICD-10-CM

## 2021-11-27 DIAGNOSIS — F422 Mixed obsessional thoughts and acts: Secondary | ICD-10-CM

## 2021-11-27 DIAGNOSIS — G4700 Insomnia, unspecified: Secondary | ICD-10-CM

## 2021-11-27 MED ORDER — DIAZEPAM 10 MG PO TABS: 10.0000 mg | ORAL_TABLET | Freq: Every day | ORAL | 0 refills | Status: DC

## 2021-11-27 MED ORDER — AMPHETAMINE-DEXTROAMPHETAMINE 20 MG PO TABS: 20.0000 mg | ORAL_TABLET | Freq: Every day | ORAL | 0 refills | Status: DC

## 2021-11-27 MED ORDER — AMPHETAMINE-DEXTROAMPHET ER 30 MG PO CP24: 30.0000 mg | ORAL_CAPSULE | Freq: Every day | ORAL | 0 refills | Status: DC

## 2021-11-27 NOTE — Telephone Encounter (Signed)
Pended.

## 2021-11-27 NOTE — Telephone Encounter (Signed)
Pt requesting Rx for Adderall XR 30 mg  and adderall 20 mg. Also Valium 10 mg. [facility] [place]

## 2021-12-03 ENCOUNTER — Ambulatory Visit: Payer: BC Managed Care – PPO | Admitting: Adult Health

## 2021-12-03 NOTE — Progress Notes (Signed)
Patient no show appointment. ? ?

## 2022-01-01 ENCOUNTER — Ambulatory Visit: Payer: BC Managed Care – PPO | Admitting: Adult Health

## 2022-01-01 NOTE — Progress Notes (Signed)
Patient no show appointment. ? ?

## 2022-01-07 ENCOUNTER — Telehealth: Payer: Self-pay | Admitting: Adult Health

## 2022-01-07 ENCOUNTER — Other Ambulatory Visit: Payer: Self-pay

## 2022-01-07 DIAGNOSIS — F902 Attention-deficit hyperactivity disorder, combined type: Secondary | ICD-10-CM

## 2022-01-07 DIAGNOSIS — G4700 Insomnia, unspecified: Secondary | ICD-10-CM

## 2022-01-07 DIAGNOSIS — F3341 Major depressive disorder, recurrent, in partial remission: Secondary | ICD-10-CM

## 2022-01-07 DIAGNOSIS — F422 Mixed obsessional thoughts and acts: Secondary | ICD-10-CM

## 2022-01-07 MED ORDER — AMPHETAMINE-DEXTROAMPHETAMINE 20 MG PO TABS: 20.0000 mg | ORAL_TABLET | Freq: Every day | ORAL | 0 refills | Status: DC

## 2022-01-07 MED ORDER — DIAZEPAM 10 MG PO TABS: 10.0000 mg | ORAL_TABLET | Freq: Every day | ORAL | 0 refills | Status: DC

## 2022-01-07 NOTE — Telephone Encounter (Signed)
Patient lvm requesting refill for Adderall 30mg  20mg  and Dizaepam 10mg . States he has been out and its starting to effect sleeping habits. Ph: [phone] Appt 1/24.Pharmacy [address]

## 2022-01-07 NOTE — Telephone Encounter (Signed)
Pended.

## 2022-01-14 ENCOUNTER — Other Ambulatory Visit: Payer: Self-pay

## 2022-01-14 ENCOUNTER — Telehealth: Payer: Self-pay | Admitting: Adult Health

## 2022-01-14 MED ORDER — AMPHETAMINE-DEXTROAMPHET ER 25 MG PO CP24: 25.0000 mg | ORAL_CAPSULE | ORAL | 0 refills | Status: DC

## 2022-01-14 MED ORDER — AMPHETAMINE-DEXTROAMPHETAMINE 10 MG PO TABS: 20.0000 mg | ORAL_TABLET | Freq: Every day | ORAL | 0 refills | Status: DC

## 2022-01-14 NOTE — Telephone Encounter (Signed)
Pt LVM stating that instead of the Adderall IR 20mg  that he takes, the pharmacy has 10mg  and 60 pills in stock.  Instead of the Adderall ER 30mg  he takes, they have the 25 Adderall ER 25mg .  He really needs it filled asap as he is starting grad school, he is sleeping 20 hours a day and having panic attacks from not having his meds.    Pharmacy is [facility] [address] in [place].  Pls call him back.  Next appt 1/24

## 2022-01-15 ENCOUNTER — Encounter: Payer: Self-pay | Admitting: Adult Health

## 2022-01-15 ENCOUNTER — Telehealth (INDEPENDENT_AMBULATORY_CARE_PROVIDER_SITE_OTHER): Payer: BC Managed Care – PPO | Admitting: Adult Health

## 2022-01-15 DIAGNOSIS — G4700 Insomnia, unspecified: Secondary | ICD-10-CM

## 2022-01-15 DIAGNOSIS — F422 Mixed obsessional thoughts and acts: Secondary | ICD-10-CM

## 2022-01-15 DIAGNOSIS — F902 Attention-deficit hyperactivity disorder, combined type: Secondary | ICD-10-CM | POA: Diagnosis not present

## 2022-01-15 DIAGNOSIS — F3341 Major depressive disorder, recurrent, in partial remission: Secondary | ICD-10-CM | POA: Diagnosis not present

## 2022-01-15 MED ORDER — AMPHETAMINE-DEXTROAMPHETAMINE 20 MG PO TABS: 20.0000 mg | ORAL_TABLET | Freq: Every day | ORAL | 0 refills | Status: DC

## 2022-01-15 MED ORDER — AMPHETAMINE-DEXTROAMPHET ER 25 MG PO CP24: 25.0000 mg | ORAL_CAPSULE | ORAL | 0 refills | Status: DC

## 2022-01-15 MED ORDER — ESCITALOPRAM OXALATE 20 MG PO TABS: 20.0000 mg | ORAL_TABLET | Freq: Every day | ORAL | 5 refills | Status: DC

## 2022-01-15 MED ORDER — DIAZEPAM 10 MG PO TABS: 10.0000 mg | ORAL_TABLET | Freq: Every day | ORAL | 2 refills | Status: DC

## 2022-01-15 NOTE — Progress Notes (Signed)
Tristan HousemanJacob W Melling [id number] February 21, 1996 26 y.o.  Virtual Visit via Video Note  I connected with pt @ on 01/15/22 at  3:40 PM EST by a video enabled telemedicine application and verified that I am speaking with the correct person using two identifiers.   I discussed the limitations of evaluation and management by telemedicine and the availability of in person appointments. The patient expressed understanding and agreed to proceed.  I discussed the assessment and treatment plan with the patient. The patient was provided an opportunity to ask questions and all were answered. The patient agreed with the plan and demonstrated an understanding of the instructions.   The patient was advised to call back or seek an in-person evaluation if the symptoms worsen or if the condition fails to improve as anticipated.  I provided 25 minutes of non-face-to-face time during this encounter.  The patient was located at home.  The provider was located at [facility].   Dorothyann Gibbsegina N Korianna Washer, NP   Subjective:   Patient ID:  Tristan Mason is a 26 y.o. (DOB February 21, 1996) male.  Chief Complaint: No chief complaint on file.   HPI Tristan HousemanJacob W Ault presents for follow-up of MDD, ADHD, insomnia, and mixed obsessional thoughts and acts.  Describes mood today as "ok". Pleasant. Mood symptoms - denies depression and irritability. Increased anxiety with heavy course load. Mood consistent. Stating "I'm doing ok overall". Feels like medications work well. Accepted into graduate school at [employer]. Stable interest and motivation. Taking medications as prescribed.  Energy levels stable. Active, does not have a regular exercise routine.   Enjoys some usual interests and activities. Dating. In a relationship. Family in [place]. Appetite adequate. Weight gain - 4 pounds - 168 pounds. Sleeps well most nights. Averages 5 hours.   Focus and concentration stable. Completing tasks. Managing aspects of household. Senior at  [employer]. Denies SI or HI.  Denies AH or VH.  Previous medication trials: Vyvanse, Adderall IR,    Review of Systems:  Review of Systems  Musculoskeletal:  Negative for gait problem.  Neurological:  Negative for tremors.  Psychiatric/Behavioral:         Please refer to HPI   Medications: I have reviewed the patient's current medications.  Current Outpatient Medications  Medication Sig Dispense Refill   amphetamine-dextroamphetamine (ADDERALL XR) 25 MG 24 hr capsule Take 1 capsule by mouth every morning. 30 capsule 0   amphetamine-dextroamphetamine (ADDERALL XR) 30 MG 24 hr capsule Take 1 capsule (30 mg total) by mouth daily. 30 capsule 0   amphetamine-dextroamphetamine (ADDERALL) 10 MG tablet Take 2 tablets (20 mg total) by mouth daily with breakfast. 60 tablet 0   amphetamine-dextroamphetamine (ADDERALL) 20 MG tablet Take 1 tablet (20 mg total) by mouth daily. 30 tablet 0   diazepam (VALIUM) 10 MG tablet Take 1 tablet (10 mg total) by mouth at bedtime. 30 tablet 0   escitalopram (LEXAPRO) 20 MG tablet Take 1 tablet (20 mg total) by mouth at bedtime. 30 tablet 5   No current facility-administered medications for this visit.    Medication Side Effects: None  Allergies: No Known Allergies  Past Medical History:  Diagnosis Date   ADHD (attention deficit hyperactivity disorder)    Dyslexia    ODD (oppositional defiant disorder)     No family history on file.  Social History   Socioeconomic History   Marital status: Single    Spouse name: Not on file   Number of children: Not on file   Years of  education: Not on file   Highest education level: Not on file  Occupational History   Not on file  Tobacco Use   Smoking status: Some Days    Types: Cigarettes   Smokeless tobacco: Former  Building services engineer Use: Every day  Substance and Sexual Activity   Alcohol use: No   Drug use: Yes    Types: Marijuana    Comment: 3 times weekly only on weekend and after 1800 expecting  less when school in session   Sexual activity: Yes    Partners: Female  Other Topics Concern   Not on file  Social History Narrative   Not on file   Social Determinants of Health   Financial Resource Strain: Not on file  Food Insecurity: Not on file  Transportation Needs: Not on file  Physical Activity: Not on file  Stress: Not on file  Social Connections: Not on file  Intimate Partner Violence: Not on file    Past Medical History, Surgical history, Social history, and Family history were reviewed and updated as appropriate.   Please see review of systems for further details on the patient's review from today.   Objective:   Physical Exam:  There were no vitals taken for this visit.  Physical Exam Constitutional:      General: He is not in acute distress. Musculoskeletal:        General: No deformity.  Neurological:     Mental Status: He is alert and oriented to person, place, and time.     Coordination: Coordination normal.  Psychiatric:        Attention and Perception: Attention and perception normal. He does not perceive auditory or visual hallucinations.        Mood and Affect: Mood normal. Mood is not anxious or depressed. Affect is not labile, blunt, angry or inappropriate.        Speech: Speech normal.        Behavior: Behavior normal.        Thought Content: Thought content normal. Thought content is not paranoid or delusional. Thought content does not include homicidal or suicidal ideation. Thought content does not include homicidal or suicidal plan.        Cognition and Memory: Cognition and memory normal.        Judgment: Judgment normal.     Comments: Insight intact    Lab Review:     Component Value Date/Time   NA 137 02/01/2015 0639   K 3.8 02/01/2015 0639   CL 103 02/01/2015 0639   CO2 28 02/01/2015 0639   GLUCOSE 86 02/01/2015 0639   BUN 16 02/01/2015 0639   CREATININE 0.82 02/01/2015 0639   CALCIUM 9.2 02/01/2015 0639   PROT 7.2 02/01/2015  0639   ALBUMIN 4.6 02/01/2015 0639   AST 17 02/01/2015 0639   ALT 13 02/01/2015 0639   ALKPHOS 64 02/01/2015 0639   BILITOT 1.3 (H) 02/01/2015 0639   GFRNONAA NOT CALCULATED 02/01/2015 0639   GFRAA NOT CALCULATED 02/01/2015 0639       Component Value Date/Time   WBC 6.7 02/01/2015 0639   RBC 4.70 02/01/2015 0639   HGB 14.6 02/01/2015 0639   HCT 42.3 02/01/2015 0639   PLT 222 02/01/2015 0639   MCV 90.0 02/01/2015 0639   MCH 31.1 02/01/2015 0639   MCHC 34.5 02/01/2015 0639   RDW 12.5 02/01/2015 0639   LYMPHSABS 1.5 05/20/2014 1645   MONOABS 1.0 05/20/2014 1645   EOSABS 0.1 05/20/2014 1645  BASOSABS 0.0 05/20/2014 1645    No results found for: POCLITH, LITHIUM   No results found for: PHENYTOIN, PHENOBARB, VALPROATE, CBMZ   .res Assessment: Plan:    Plan:  PDMP reviewed  1. Lexapro 20mg  daily 2. Adderall XR 30mg  daily 3. Adderall 20mg  in the afternoon 4. Valium 10mg  at bedtime   Consider Vyvanse again when generic  Read and reviewed note with patient for accuracy.   RTC 3 months  Patient advised to contact office with any questions, adverse effects, or acute worsening in signs and symptoms.  Discussed potential benefits, risks, and side effects of stimulants with patient to include increased heart rate, palpitations, insomnia, increased anxiety, increased irritability, or decreased appetite.  Instructed patient to contact office if experiencing any significant tolerability issues.  Discussed potential benefits, risk, and side effects of benzodiazepines to include potential risk of tolerance and dependence, as well as possible drowsiness.  Advised patient not to drive if experiencing drowsiness and to take lowest possible effective dose to minimize risk of dependence and tolerance. There are no diagnoses linked to this encounter.   Please see After Visit Summary for patient specific instructions.  Future Appointments  Date Time Provider Department Center   01/15/2022  3:40 PM Breanna Mcdaniel, , NP [facility] None    No orders of the defined types were placed in this encounter.     -------------------------------

## 2022-01-15 NOTE — Telephone Encounter (Signed)
Pended.

## 2022-04-22 ENCOUNTER — Other Ambulatory Visit: Payer: Self-pay

## 2022-04-22 ENCOUNTER — Telehealth: Payer: Self-pay | Admitting: Adult Health

## 2022-04-22 DIAGNOSIS — G4700 Insomnia, unspecified: Secondary | ICD-10-CM

## 2022-04-22 DIAGNOSIS — F422 Mixed obsessional thoughts and acts: Secondary | ICD-10-CM

## 2022-04-22 DIAGNOSIS — F902 Attention-deficit hyperactivity disorder, combined type: Secondary | ICD-10-CM

## 2022-04-22 DIAGNOSIS — F3341 Major depressive disorder, recurrent, in partial remission: Secondary | ICD-10-CM

## 2022-04-22 MED ORDER — AMPHETAMINE-DEXTROAMPHETAMINE 20 MG PO TABS: 20.0000 mg | ORAL_TABLET | Freq: Every day | ORAL | 0 refills | Status: DC

## 2022-04-22 MED ORDER — DIAZEPAM 10 MG PO TABS: 10.0000 mg | ORAL_TABLET | Freq: Every day | ORAL | 2 refills | Status: DC

## 2022-04-22 NOTE — Telephone Encounter (Signed)
Pt lvm at 11:59 am asking for a refill on his adderall 25 mg xr adderall 20 mg and on his valium 10 mg to the [facility] [address] [place], [place]. He said pharmacy has the adderall in stock ?

## 2022-04-22 NOTE — Telephone Encounter (Signed)
Pended.

## 2022-04-24 ENCOUNTER — Other Ambulatory Visit: Payer: Self-pay | Admitting: Adult Health

## 2022-04-24 ENCOUNTER — Telehealth: Payer: Self-pay | Admitting: Adult Health

## 2022-04-24 ENCOUNTER — Encounter: Payer: Self-pay | Admitting: Adult Health

## 2022-04-24 DIAGNOSIS — F3341 Major depressive disorder, recurrent, in partial remission: Secondary | ICD-10-CM

## 2022-04-24 DIAGNOSIS — G4700 Insomnia, unspecified: Secondary | ICD-10-CM

## 2022-04-24 DIAGNOSIS — F422 Mixed obsessional thoughts and acts: Secondary | ICD-10-CM

## 2022-04-24 MED ORDER — AMPHETAMINE-DEXTROAMPHET ER 25 MG PO CP24: 25.0000 mg | ORAL_CAPSULE | ORAL | 0 refills | Status: DC

## 2022-04-24 MED ORDER — AMPHETAMINE-DEXTROAMPHETAMINE 30 MG PO TABS: 30.0000 mg | ORAL_TABLET | Freq: Every day | ORAL | 0 refills | Status: DC

## 2022-04-24 MED ORDER — DIAZEPAM 10 MG PO TABS: 10.0000 mg | ORAL_TABLET | Freq: Every day | ORAL | 2 refills | Status: DC

## 2022-04-24 NOTE — Telephone Encounter (Signed)
Pt LVM on 5/2 @ 6:28p.  Pharmacy doesn't have Adderall 20mg , but they have 30mg .  He would like the Adderall 30mg .  They also have the Adderall XR 25mg  capsules.  Pls send scripts to ? ?[facility] [place], [place]  ?[address] [place] [address]  ?Phone:  [phone]  Fax:  [phone]  ? ?No upcoming appt scheduled. ?

## 2022-04-24 NOTE — Telephone Encounter (Signed)
Pt called requesting Valium Rx to [facility] [address] Vivia Budge ?

## 2022-04-24 NOTE — Telephone Encounter (Signed)
Scripts sent

## 2022-04-24 NOTE — Telephone Encounter (Signed)
Script sent  

## 2022-05-09 NOTE — Telephone Encounter (Signed)
Patient lvm @ 2:54 stating that his prescription for Adderall 25mg  has been on hold for about three weeks now and was informed that they would not be getting any 25mg  anytime soon. They do have 20mg  in stock. Says that he wanted to increase his dose anyway and wants prescription sent in for 20mg  to be taken twice a day for a total of 40mg  pending approval from [facility] for the increased dosage. Pls rtc to discuss [phone] [facility] [address] [facility] Appt 6/6

## 2022-05-09 NOTE — Telephone Encounter (Signed)
Error...message from [place] was same message I got.

## 2022-05-14 NOTE — Telephone Encounter (Signed)
Patient is asking to increase his 20 mg IR dose of Adderall to 20 mg BID. If ok I'll pend. He stated he has gained weight recently and his focus isn't as good, so he thinks he needs to increase dose.

## 2022-05-14 NOTE — Telephone Encounter (Signed)
Needs an appt to discuss.

## 2022-05-28 ENCOUNTER — Ambulatory Visit (INDEPENDENT_AMBULATORY_CARE_PROVIDER_SITE_OTHER): Payer: Self-pay | Admitting: Adult Health

## 2022-05-28 DIAGNOSIS — Z91199 Patient's noncompliance with other medical treatment and regimen due to unspecified reason: Secondary | ICD-10-CM

## 2022-05-28 NOTE — Progress Notes (Signed)
Patient no show appointment. ? ?

## 2022-06-18 ENCOUNTER — Telehealth: Payer: Self-pay | Admitting: Adult Health

## 2022-06-18 NOTE — Telephone Encounter (Signed)
Apt 7/21. Pt no longer needs requested 30 mg sent. Reported pharmacy had previous Rx on file and called to advise pt ready for pick up.

## 2022-07-12 ENCOUNTER — Encounter: Payer: Self-pay | Admitting: Adult Health

## 2022-07-12 ENCOUNTER — Telehealth (INDEPENDENT_AMBULATORY_CARE_PROVIDER_SITE_OTHER): Payer: BC Managed Care – PPO | Admitting: Adult Health

## 2022-07-12 DIAGNOSIS — F422 Mixed obsessional thoughts and acts: Secondary | ICD-10-CM

## 2022-07-12 DIAGNOSIS — F902 Attention-deficit hyperactivity disorder, combined type: Secondary | ICD-10-CM | POA: Diagnosis not present

## 2022-07-12 DIAGNOSIS — F3341 Major depressive disorder, recurrent, in partial remission: Secondary | ICD-10-CM | POA: Diagnosis not present

## 2022-07-12 DIAGNOSIS — G4700 Insomnia, unspecified: Secondary | ICD-10-CM

## 2022-07-12 MED ORDER — AMPHETAMINE-DEXTROAMPHETAMINE 30 MG PO TABS: 30.0000 mg | ORAL_TABLET | Freq: Two times a day (BID) | ORAL | 0 refills | Status: DC

## 2022-07-12 MED ORDER — DIAZEPAM 10 MG PO TABS: 10.0000 mg | ORAL_TABLET | Freq: Every day | ORAL | 2 refills | Status: DC

## 2022-07-12 MED ORDER — ESCITALOPRAM OXALATE 20 MG PO TABS: 20.0000 mg | ORAL_TABLET | Freq: Every day | ORAL | 5 refills | Status: DC

## 2022-07-12 NOTE — Progress Notes (Addendum)
Tristan Mason [id number] January 01, 1996 26 y.o.  Virtual Visit via Telephone Note  I connected with pt on 07/12/22 at  1:00 PM EDT by telephone and verified that I am speaking with the correct person using two identifiers.   I discussed the limitations, risks, security and privacy concerns of performing an evaluation and management service by telephone and the availability of in person appointments. I also discussed with the patient that there may be a patient responsible charge related to this service. The patient expressed understanding and agreed to proceed.   I discussed the assessment and treatment plan with the patient. The patient was provided an opportunity to ask questions and all were answered. The patient agreed with the plan and demonstrated an understanding of the instructions.   The patient was advised to call back or seek an in-person evaluation if the symptoms worsen or if the condition fails to improve as anticipated.  I provided 25 minutes of non-face-to-face time during this encounter.  The patient was located at home.  The provider was located at [facility].   Dorothyann Gibbs, NP   Subjective:   Patient ID:  Tristan Mason is a 26 y.o. (DOB 1996/05/28) male.  Chief Complaint: No chief complaint on file.   HPI Tristan Mason presents for follow-up of MDD, ADHD, insomnia, and mixed obsessional thoughts and acts.  Describes mood today as "ok". Pleasant. Mood symptoms - denies depression, anxiety and irritability. Mood is consistent. Stating "I'm doing alright". Has some concerns with the Adderall and it's availability - wanting to try a different dosing or schedule. Feels like the Lexapro works well but could be "dulling" emotions. Lacking interest and motivation. Willing to consider other options.Taking medications as prescribed.  Energy levels stable. Active, does not have a regular exercise routine.   Enjoys some usual interests and activities. Dating. In a  relationship. Family in [place]. Appetite adequate. Weight gain - 178 from 168 pounds. Sleeps well most nights. Averages 5 to 6 hours.   Focus and concentration stable. Completing tasks. Managing aspects of household. Senior at [employer]. Denies SI or HI.  Denies AH or VH.  Previous medication trials: Vyvanse, Adderall IR,    Review of Systems:  Review of Systems  Musculoskeletal:  Negative for gait problem.  Neurological:  Negative for tremors.  Psychiatric/Behavioral:         Please refer to HPI    Medications: I have reviewed the patient's current medications.  Current Outpatient Medications  Medication Sig Dispense Refill   amphetamine-dextroamphetamine (ADDERALL) 30 MG tablet Take 1 tablet by mouth 2 (two) times daily. 60 tablet 0   diazepam (VALIUM) 10 MG tablet Take 1 tablet (10 mg total) by mouth at bedtime. 30 tablet 2   escitalopram (LEXAPRO) 20 MG tablet Take 1 tablet (20 mg total) by mouth at bedtime. 30 tablet 5   No current facility-administered medications for this visit.    Medication Side Effects: None  Allergies: No Known Allergies  Past Medical History:  Diagnosis Date   ADHD (attention deficit hyperactivity disorder)    Dyslexia    ODD (oppositional defiant disorder)     No family history on file.  Social History   Socioeconomic History   Marital status: Single    Spouse name: Not on file   Number of children: Not on file   Years of education: Not on file   Highest education level: Not on file  Occupational History   Not on file  Tobacco  Use   Smoking status: Some Days    Types: Cigarettes   Smokeless tobacco: Former  Building services engineer Use: Every day  Substance and Sexual Activity   Alcohol use: No   Drug use: Yes    Types: Marijuana    Comment: 3 times weekly only on weekend and after 1800 expecting less when school in session   Sexual activity: Yes    Partners: Female  Other Topics Concern   Not on file  Social History  Narrative   Not on file   Social Determinants of Health   Financial Resource Strain: Not on file  Food Insecurity: Not on file  Transportation Needs: Not on file  Physical Activity: Not on file  Stress: Not on file  Social Connections: Not on file  Intimate Partner Violence: Not on file    Past Medical History, Surgical history, Social history, and Family history were reviewed and updated as appropriate.   Please see review of systems for further details on the patient's review from today.   Objective:   Physical Exam:  There were no vitals taken for this visit.  Physical Exam Constitutional:      General: He is not in acute distress. Musculoskeletal:        General: No deformity.  Neurological:     Mental Status: He is alert and oriented to person, place, and time.     Coordination: Coordination normal.  Psychiatric:        Attention and Perception: Attention and perception normal. He does not perceive auditory or visual hallucinations.        Mood and Affect: Mood normal. Mood is not anxious or depressed. Affect is not labile, blunt, angry or inappropriate.        Speech: Speech normal.        Behavior: Behavior normal.        Thought Content: Thought content normal. Thought content is not paranoid or delusional. Thought content does not include homicidal or suicidal ideation. Thought content does not include homicidal or suicidal plan.        Cognition and Memory: Cognition and memory normal.        Judgment: Judgment normal.     Comments: Insight intact     Lab Review:     Component Value Date/Time   NA 137 02/01/2015 0639   K 3.8 02/01/2015 0639   CL 103 02/01/2015 0639   CO2 28 02/01/2015 0639   GLUCOSE 86 02/01/2015 0639   BUN 16 02/01/2015 0639   CREATININE 0.82 02/01/2015 0639   CALCIUM 9.2 02/01/2015 0639   PROT 7.2 02/01/2015 0639   ALBUMIN 4.6 02/01/2015 0639   AST 17 02/01/2015 0639   ALT 13 02/01/2015 0639   ALKPHOS 64 02/01/2015 0639   BILITOT  1.3 (H) 02/01/2015 0639   GFRNONAA NOT CALCULATED 02/01/2015 0639   GFRAA NOT CALCULATED 02/01/2015 0639       Component Value Date/Time   WBC 6.7 02/01/2015 0639   RBC 4.70 02/01/2015 0639   HGB 14.6 02/01/2015 0639   HCT 42.3 02/01/2015 0639   PLT 222 02/01/2015 0639   MCV 90.0 02/01/2015 0639   MCH 31.1 02/01/2015 0639   MCHC 34.5 02/01/2015 0639   RDW 12.5 02/01/2015 0639   LYMPHSABS 1.5 05/20/2014 1645   MONOABS 1.0 05/20/2014 1645   EOSABS 0.1 05/20/2014 1645   BASOSABS 0.0 05/20/2014 1645    No results found for: "POCLITH", "LITHIUM"   No results found for: "  PHENYTOIN", "PHENOBARB", "VALPROATE", "CBMZ"   .res Assessment: Plan:    Plan:  PDMP reviewed  1. Lexapro 20mg  daily 2. Adderall XR 30mg  daily 3. Adderall 20mg  in the afternoon 4. Valium 10mg  at bedtime   Consider Vyvanse again when generic  Read and reviewed note with patient for accuracy.   RTC 3 months  Patient advised to contact office with any questions, adverse effects, or acute worsening in signs and symptoms.  Discussed potential benefits, risks, and side effects of stimulants with patient to include increased heart rate, palpitations, insomnia, increased anxiety, increased irritability, or decreased appetite.  Instructed patient to contact office if experiencing any significant tolerability issues.  Discussed potential benefits, risk, and side effects of benzodiazepines to include potential risk of tolerance and dependence, as well as possible drowsiness.  Advised patient not to drive if experiencing drowsiness and to take lowest possible effective dose to minimize risk of dependence and tolerance.   Diagnoses and all orders for this visit:  Recurrent major depression in partial remission ([place]) -     diazepam (VALIUM) 10 MG tablet; Take 1 tablet (10 mg total) by mouth at bedtime. -     escitalopram (LEXAPRO) 20 MG tablet; Take 1 tablet (20 mg total) by mouth at bedtime.  Attention deficit  hyperactivity disorder (ADHD), combined type, moderate -     amphetamine-dextroamphetamine (ADDERALL) 30 MG tablet; Take 1 tablet by mouth 2 (two) times daily.  Mixed obsessional thoughts and acts -     diazepam (VALIUM) 10 MG tablet; Take 1 tablet (10 mg total) by mouth at bedtime. -     escitalopram (LEXAPRO) 20 MG tablet; Take 1 tablet (20 mg total) by mouth at bedtime.  Insomnia, unspecified type -     diazepam (VALIUM) 10 MG tablet; Take 1 tablet (10 mg total) by mouth at bedtime.    Please see After Visit Summary for patient specific instructions.  No future appointments.   No orders of the defined types were placed in this encounter.     -------------------------------

## 2022-07-12 NOTE — Addendum Note (Signed)
Addended by: Dorothyann Gibbs on: 07/12/2022 05:34 PM   Modules accepted: Orders

## 2022-07-15 ENCOUNTER — Telehealth: Payer: Self-pay | Admitting: Adult Health

## 2022-07-15 NOTE — Telephone Encounter (Signed)
Error

## 2022-08-12 ENCOUNTER — Telehealth: Payer: Self-pay | Admitting: Adult Health

## 2022-08-12 ENCOUNTER — Other Ambulatory Visit: Payer: Self-pay

## 2022-08-12 DIAGNOSIS — F902 Attention-deficit hyperactivity disorder, combined type: Secondary | ICD-10-CM

## 2022-08-12 MED ORDER — AMPHETAMINE-DEXTROAMPHETAMINE 30 MG PO TABS: 30.0000 mg | ORAL_TABLET | Freq: Two times a day (BID) | ORAL | 0 refills | Status: DC

## 2022-08-12 NOTE — Telephone Encounter (Signed)
Pended.

## 2022-08-12 NOTE — Telephone Encounter (Signed)
Patient lvm at 2:12 requesting refill for Adderall. States he did really well on this dosage. Ph: [phone] Appt 8/25. [facility]acy [facility] [address] [place]

## 2022-08-16 ENCOUNTER — Ambulatory Visit (INDEPENDENT_AMBULATORY_CARE_PROVIDER_SITE_OTHER): Payer: Self-pay | Admitting: Adult Health

## 2022-08-16 DIAGNOSIS — F489 Nonpsychotic mental disorder, unspecified: Secondary | ICD-10-CM

## 2022-08-16 NOTE — Progress Notes (Signed)
Patient no show appointment. ? ?

## 2022-10-08 ENCOUNTER — Other Ambulatory Visit: Payer: Self-pay

## 2022-10-08 ENCOUNTER — Telehealth: Payer: Self-pay | Admitting: Adult Health

## 2022-10-08 DIAGNOSIS — F902 Attention-deficit hyperactivity disorder, combined type: Secondary | ICD-10-CM

## 2022-10-08 DIAGNOSIS — G4700 Insomnia, unspecified: Secondary | ICD-10-CM

## 2022-10-08 DIAGNOSIS — F3341 Major depressive disorder, recurrent, in partial remission: Secondary | ICD-10-CM

## 2022-10-08 DIAGNOSIS — F422 Mixed obsessional thoughts and acts: Secondary | ICD-10-CM

## 2022-10-08 MED ORDER — DIAZEPAM 10 MG PO TABS: 10.0000 mg | ORAL_TABLET | Freq: Every day | ORAL | 0 refills | Status: DC

## 2022-10-08 MED ORDER — AMPHETAMINE-DEXTROAMPHETAMINE 30 MG PO TABS: 30.0000 mg | ORAL_TABLET | Freq: Two times a day (BID) | ORAL | 0 refills | Status: DC

## 2022-10-08 NOTE — Telephone Encounter (Signed)
Appointments

## 2022-10-08 NOTE — Telephone Encounter (Signed)
Last 2 appst he was a no show ok to pend?

## 2022-10-08 NOTE — Telephone Encounter (Signed)
Send in a 30 day and have him make an appt.

## 2022-10-08 NOTE — Telephone Encounter (Signed)
Please schedule appt

## 2022-10-08 NOTE — Telephone Encounter (Signed)
Pt called at 4:15p.  He would like refill of Valium 10mg  and Adderall 30mg  to [facility] on [address] in [place].  NO upcoming appts scheduled.

## 2022-10-09 NOTE — Telephone Encounter (Signed)
Voice mail is full  

## 2022-11-04 ENCOUNTER — Other Ambulatory Visit: Payer: Self-pay

## 2022-11-04 ENCOUNTER — Telehealth: Payer: Self-pay | Admitting: Adult Health

## 2022-11-04 DIAGNOSIS — F422 Mixed obsessional thoughts and acts: Secondary | ICD-10-CM

## 2022-11-04 DIAGNOSIS — F3341 Major depressive disorder, recurrent, in partial remission: Secondary | ICD-10-CM

## 2022-11-04 DIAGNOSIS — G4700 Insomnia, unspecified: Secondary | ICD-10-CM

## 2022-11-04 MED ORDER — DIAZEPAM 10 MG PO TABS: 10.0000 mg | ORAL_TABLET | Freq: Every day | ORAL | 0 refills | Status: DC

## 2022-11-04 NOTE — Telephone Encounter (Signed)
Da called and LM 10:30am. He is in need of two RFS- Valium and Adderall 30mg . However, he states he would like to switch to generic Vyvanse instead of Adderall if this is possible.Please let him know. Can send both to on [facility] in [place], [facility] [phone] - [place], [place] - [address] AT [facility] [address]

## 2022-11-04 NOTE — Telephone Encounter (Signed)
Pended.

## 2022-11-04 NOTE — Telephone Encounter (Signed)
Please schedule appt.He did not answer for me

## 2022-11-04 NOTE — Telephone Encounter (Signed)
Pt has had 2 no shows this year and was seen  in January but also had 2 no shows before that date.Last month you said to send a 30 day and have him make an appt.He never called back to schedule.He is now asking to change his stimulant.Please review

## 2022-11-05 ENCOUNTER — Ambulatory Visit (INDEPENDENT_AMBULATORY_CARE_PROVIDER_SITE_OTHER): Payer: BC Managed Care – PPO | Admitting: Adult Health

## 2022-11-05 ENCOUNTER — Encounter: Payer: Self-pay | Admitting: Adult Health

## 2022-11-05 DIAGNOSIS — F422 Mixed obsessional thoughts and acts: Secondary | ICD-10-CM | POA: Diagnosis not present

## 2022-11-05 DIAGNOSIS — G4700 Insomnia, unspecified: Secondary | ICD-10-CM

## 2022-11-05 DIAGNOSIS — F902 Attention-deficit hyperactivity disorder, combined type: Secondary | ICD-10-CM

## 2022-11-05 DIAGNOSIS — F3341 Major depressive disorder, recurrent, in partial remission: Secondary | ICD-10-CM | POA: Diagnosis not present

## 2022-11-05 MED ORDER — LISDEXAMFETAMINE DIMESYLATE 30 MG PO CAPS: 30.0000 mg | ORAL_CAPSULE | Freq: Every day | ORAL | 0 refills | Status: DC

## 2022-11-05 MED ORDER — DIAZEPAM 10 MG PO TABS: 10.0000 mg | ORAL_TABLET | Freq: Every day | ORAL | 2 refills | Status: DC

## 2022-11-05 MED ORDER — ESCITALOPRAM OXALATE 20 MG PO TABS: 20.0000 mg | ORAL_TABLET | Freq: Every day | ORAL | 5 refills | Status: DC

## 2022-11-05 MED ORDER — AMPHETAMINE-DEXTROAMPHETAMINE 20 MG PO TABS: 20.0000 mg | ORAL_TABLET | Freq: Every day | ORAL | 0 refills | Status: DC

## 2022-11-05 NOTE — Progress Notes (Signed)
FEDERICO MAIORINO [id number] September 23, 1996 26 y.o.  Virtual Visit via Telephone Note  I connected with pt on 11/05/22 at  8:40 AM EST by telephone and verified that I am speaking with the correct person using two identifiers.   I discussed the limitations, risks, security and privacy concerns of performing an evaluation and management service by telephone and the availability of in person appointments. I also discussed with the patient that there may be a patient responsible charge related to this service. The patient expressed understanding and agreed to proceed.   I discussed the assessment and treatment plan with the patient. The patient was provided an opportunity to ask questions and all were answered. The patient agreed with the plan and demonstrated an understanding of the instructions.   The patient was advised to call back or seek an in-person evaluation if the symptoms worsen or if the condition fails to improve as anticipated.  I provided 25 minutes of non-face-to-face time during this encounter.  The patient was located at home.  The provider was located at [facility].   Dorothyann Gibbs, NP   Subjective:   Patient ID:  Tristan Mason is a 26 y.o. (DOB 13-Nov-1996) male.  Chief Complaint: No chief complaint on file.   HPI Tristan Mason presents for follow-up of MDD, ADHD, insomnia, and mixed obsessional thoughts and acts.  Describes mood today as "ok". Pleasant. Denies tearfulness. Mood symptoms - decreased depression - taking Lexapro consistently. Denies anxiety and irritability. Mood is stable. Stating "I feel like I'm doing alright". Feels like medications work well - would like to switch back to Vyvanse now that it is generic. Improved interest and motivation. Taking medications as prescribed.  Energy levels stable. Active, does not have a regular exercise routine.   Enjoys some usual interests and activities. Lives with girlfriend. Family in [place]. Appetite  adequate. Weight gain - 182 pounds. Sleeps well most nights. Averages 5 to 6 hours.   Focus and concentration stable. Completing tasks. Managing aspects of household. 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 Denies SI or HI.  Denies AH or VH.  Previous medication trials: Vyvanse, Adderall IR   Review of Systems:  Review of Systems  Musculoskeletal:  Negative for gait problem.  Neurological:  Negative for tremors.  Psychiatric/Behavioral:         Please refer to HPI    Medications: I have reviewed the patient's current medications.  Current Outpatient Medications  Medication Sig Dispense Refill   amphetamine-dextroamphetamine (ADDERALL) 30 MG tablet Take 1 tablet by mouth 2 (two) times daily. 60 tablet 0   diazepam (VALIUM) 10 MG tablet Take 1 tablet (10 mg total) by mouth at bedtime. 30 tablet 0   escitalopram (LEXAPRO) 20 MG tablet Take 1 tablet (20 mg total) by mouth at bedtime. 30 tablet 5   No current facility-administered medications for this visit.    Medication Side Effects: None  Allergies: No Known Allergies  Past Medical History:  Diagnosis Date   ADHD (attention deficit hyperactivity disorder)    Dyslexia    ODD (oppositional defiant disorder)     No family history on file.  Social History   Socioeconomic History   Marital status: Single    Spouse name: Not on file   Number of children: Not on file   Years of education: Not on file   Highest education level: Not on file  Occupational History   Not on file  Tobacco Use   Smoking  status: Some Days    Types: Cigarettes   Smokeless tobacco: Former  Building services engineer Use: Every day  Substance and Sexual Activity   Alcohol use: No   Drug use: Yes    Types: Marijuana    Comment: 3 times weekly only on weekend and after 1800 expecting less when school in session   Sexual activity: Yes    Partners: Female  Other Topics Concern   Not on file  Social History Narrative   Not on file   Social  Determinants of Health   Financial Resource Strain: Not on file  Food Insecurity: Not on file  Transportation Needs: Not on file  Physical Activity: Not on file  Stress: Not on file  Social Connections: Not on file  Intimate Partner Violence: Not on file    Past Medical History, Surgical history, Social history, and Family history were reviewed and updated as appropriate.   Please see review of systems for further details on the patient's review from today.   Objective:   Physical Exam:  There were no vitals taken for this visit.  Physical Exam Constitutional:      General: He is not in acute distress. Musculoskeletal:        General: No deformity.  Neurological:     Mental Status: He is alert and oriented to person, place, and time.     Coordination: Coordination normal.  Psychiatric:        Attention and Perception: Attention and perception normal. He does not perceive auditory or visual hallucinations.        Mood and Affect: Mood normal. Mood is not anxious or depressed. Affect is not labile, blunt, angry or inappropriate.        Speech: Speech normal.        Behavior: Behavior normal.        Thought Content: Thought content normal. Thought content is not paranoid or delusional. Thought content does not include homicidal or suicidal ideation. Thought content does not include homicidal or suicidal plan.        Cognition and Memory: Cognition and memory normal.        Judgment: Judgment normal.     Comments: Insight intact     Lab Review:     Component Value Date/Time   NA 137 02/01/2015 0639   K 3.8 02/01/2015 0639   CL 103 02/01/2015 0639   CO2 28 02/01/2015 0639   GLUCOSE 86 02/01/2015 0639   BUN 16 02/01/2015 0639   CREATININE 0.82 02/01/2015 0639   CALCIUM 9.2 02/01/2015 0639   PROT 7.2 02/01/2015 0639   ALBUMIN 4.6 02/01/2015 0639   AST 17 02/01/2015 0639   ALT 13 02/01/2015 0639   ALKPHOS 64 02/01/2015 0639   BILITOT 1.3 (H) 02/01/2015 0639   GFRNONAA  NOT CALCULATED 02/01/2015 0639   GFRAA NOT CALCULATED 02/01/2015 0639       Component Value Date/Time   WBC 6.7 02/01/2015 0639   RBC 4.70 02/01/2015 0639   HGB 14.6 02/01/2015 0639   HCT 42.3 02/01/2015 0639   PLT 222 02/01/2015 0639   MCV 90.0 02/01/2015 0639   MCH 31.1 02/01/2015 0639   MCHC 34.5 02/01/2015 0639   RDW 12.5 02/01/2015 0639   LYMPHSABS 1.5 05/20/2014 1645   MONOABS 1.0 05/20/2014 1645   EOSABS 0.1 05/20/2014 1645   BASOSABS 0.0 05/20/2014 1645    No results found for: "POCLITH", "LITHIUM"   No results found for: "PHENYTOIN", "PHENOBARB", "VALPROATE", "CBMZ"   .  res Assessment: Plan:    Plan:  PDMP reviewed  Valium 10mg  at bedtime as needed for sleep Lexapro 20mg  daily Vyvanse 30mg  every morning Adderall 20mg  in the afternoon as needed  D/C Adderall XR 30mg  daily   Monitor BP between visits while taking stimulant medication.   RTC 3 months  Patient advised to contact office with any questions, adverse effects, or acute worsening in signs and symptoms.  Discussed potential benefits, risks, and side effects of stimulants with patient to include increased heart rate, palpitations, insomnia, increased anxiety, increased irritability, or decreased appetite.  Instructed patient to contact office if experiencing any significant tolerability issues.  Discussed potential benefits, risk, and side effects of benzodiazepines to include potential risk of tolerance and dependence, as well as possible drowsiness.  Advised patient not to drive if experiencing drowsiness and to take lowest possible effective dose to minimize risk of dependence and tolerance. There are no diagnoses linked to this encounter.  Please see After Visit Summary for patient specific instructions.  Future Appointments  Date Time Provider Department Center  11/05/2022  8:40 AM Kiernan Farkas, , NP CP-CP None    No orders of the defined types were placed in this encounter.      -------------------------------

## 2022-11-12 ENCOUNTER — Telehealth: Payer: Self-pay

## 2022-11-13 NOTE — Telephone Encounter (Signed)
Prior Authorization submitted with [employer] for Lisdexamfetamine 30 mg capsules approval received effective 11/13/2022-11/12/2023.  His name was listed as "Jake" in the initial PA from pharmacy I changed it to [place] and went through without issues.

## 2022-12-04 ENCOUNTER — Telehealth: Payer: Self-pay | Admitting: Adult Health

## 2022-12-04 NOTE — Telephone Encounter (Signed)
Pt called and said that he has had a hard time getting the generic vyvanse, So he would like to go back to his old regime adderall xr  30 mg 1 in am and 1 in the evening. He said that worked better [employer]. [facility] [address] in [place]

## 2022-12-05 ENCOUNTER — Other Ambulatory Visit: Payer: Self-pay

## 2022-12-05 MED ORDER — AMPHETAMINE-DEXTROAMPHETAMINE 30 MG PO TABS: 30.0000 mg | ORAL_TABLET | Freq: Every day | ORAL | 0 refills | Status: DC

## 2022-12-05 MED ORDER — AMPHETAMINE-DEXTROAMPHETAMINE 30 MG PO TABS
30.0000 mg | ORAL_TABLET | Freq: Every day | ORAL | 0 refills | Status: DC
Start: 2022-12-05 — End: 2023-01-06

## 2022-12-05 NOTE — Telephone Encounter (Signed)
Patient returned called regarding refill for Adderall 30mg . States that pharmacy where prescription is out now and has found another pharmacy. [facility] [address] [facility], [place]. Please send ASAP as patient doesn't want pharmacy to run out again. Pls call [place] when this has been done.

## 2022-12-05 NOTE — Telephone Encounter (Signed)
Patient is asking to go back to Adderall 30 XR QAM and 30 IR QPM.  He has been on several different doses as can be seen on PMP database. He last had Vyvanse 30 mg filled 11/26 so not due for a RF yet, but is due for a RF on his current Adderall dose of 20 mg. Is it okay to go ahead and send in the IR dose at 30 mg and then we can go back to 30 mg XR next refill?   11/17/2022 11/05/2022 3  Vyvanse 30 Mg Capsule 30.00 30 Re Moz [id number] [facility] (7760) 0/0  Comm Ins [place] 11/05/2022 11/05/2022 3  Dextroamp-Amphetamin 20 Mg Tab 30.00 30 Re Moz [id number] [facility] (7760) 0/0  Comm Ins [place] 11/04/2022 11/04/2022 3  Diazepam 10 Mg Tablet 30.00 30 Re Moz [id number] [facility] (7821) 0/0 1.00 LME Comm Ins [place] 10/08/2022 10/08/2022 3  Dextroamp-Amphetamin 30 Mg Tab 60.00 30 Re Moz [id number] [facility] (7760) 0/0  Comm Ins [place] 10/08/2022 10/08/2022 3  Diazepam 10 Mg Tablet 30.00 30 Re Moz [id number] [facility] (7760) 0/0 1.00 LME Comm Ins [place] 09/10/2022 07/12/2022 3  Dextroamp-Amphetamin 30 Mg Tab 60.00 30 Re Moz [id number] Wal (7821) 0/0  Comm Ins [place] 08/12/2022 08/12/2022 3  Dextroamp-Amphetamin 30 Mg Tab 60.00 30 Re Moz [id number] Wal (7760) 0/0  Comm Ins [place] 07/12/2022 07/12/2022 3  Diazepam 10 Mg Tablet 30.00 30 Re Moz [id number] Wal (7821) 0/2 1.00 LME Comm Ins [place] 07/12/2022 07/12/2022 3  Dextroamp-Amphetamin 30 Mg Tab 60.00 30 Re Moz [id number] Wal (7760) 0/0  Comm Ins [place] 06/17/2022 01/15/2022 3  Dextroamp-Amphet Er 25 Mg Cap 30.00 30 Re Moz [id number] Wal (7821) 0/0  Comm Ins [place] 06/04/2022 01/15/2022 3  Dextroamp-Amphetamin 20 Mg Tab 30.00 30 Re Moz [id number] Wal (7821) 0/0  Comm Ins [place] 06/04/2022 04/24/2022 3  Diazepam 10 Mg Tablet 30.00 30 Re Moz [id number] Wal (7821) 0/2 1.00 LME Comm Ins [place] 05/13/2022 04/24/2022 3  Dextroamp-Amphet Er 25 Mg Cap 30.00 30 Re Moz [id number] Wal (7821) 0/0  Comm Ins [place] 04/24/2022 04/24/2022 3  Dextroamp-Amphetamin 30 Mg Tab 30.00 30 Re Moz [id number] Wal (7821) 0/0  Comm  Ins [place] 04/01/2022 01/15/2022 2  Dextroamp-Amphet Er 25 Mg Cap 30.00 30 Re Moz

## 2022-12-05 NOTE — Telephone Encounter (Signed)
Pended IR dose, XR dose due 12/24.

## 2022-12-05 NOTE — Telephone Encounter (Signed)
Canceled at first pharmacy and repended.

## 2023-01-03 ENCOUNTER — Telehealth: Payer: Self-pay | Admitting: Adult Health

## 2023-01-03 ENCOUNTER — Other Ambulatory Visit: Payer: Self-pay

## 2023-01-03 DIAGNOSIS — F902 Attention-deficit hyperactivity disorder, combined type: Secondary | ICD-10-CM

## 2023-01-03 MED ORDER — LISDEXAMFETAMINE DIMESYLATE 40 MG PO CAPS: 40.0000 mg | ORAL_CAPSULE | Freq: Every day | ORAL | 0 refills | Status: DC

## 2023-01-03 NOTE — Telephone Encounter (Signed)
We can increase and pend.

## 2023-01-03 NOTE — Telephone Encounter (Signed)
Unable to reach pt,unable to LVM

## 2023-01-03 NOTE — Telephone Encounter (Signed)
Pended.

## 2023-01-03 NOTE — Telephone Encounter (Signed)
He's at 30mg  - so 40mg  daily

## 2023-01-03 NOTE — Telephone Encounter (Signed)
Im unable to reach him to discuss his request to increase dose,however he will be out if not sent today.Should I pend his regular vyvanse dose or will you increase it ?

## 2023-01-03 NOTE — Telephone Encounter (Signed)
Pt called and would like generic Vyvanse.  He said [facility] [phone] [place] is holding enough for him for 12 hours.  He said he prefers the Vyvanse instead of the Adderall, so he doesn't want that one anymore. However, he wants to see if Barnett Applebaum would talk to him about increasing the generic Vyvanse from 30mg s to 50mg  or 60mg .  Pls call him back at [phone] to discuss the increase in dosage.  Next appt scheduled for 2/2 in person.

## 2023-01-03 NOTE — Telephone Encounter (Signed)
What dose?

## 2023-01-06 ENCOUNTER — Telehealth: Payer: Self-pay | Admitting: Adult Health

## 2023-01-06 ENCOUNTER — Other Ambulatory Visit: Payer: Self-pay

## 2023-01-06 MED ORDER — LISDEXAMFETAMINE DIMESYLATE 30 MG PO CAPS: 30.0000 mg | ORAL_CAPSULE | Freq: Every day | ORAL | 0 refills | Status: DC

## 2023-01-06 MED ORDER — AMPHETAMINE-DEXTROAMPHETAMINE 30 MG PO TABS: 30.0000 mg | ORAL_TABLET | Freq: Every day | ORAL | 0 refills | Status: DC

## 2023-01-06 NOTE — Telephone Encounter (Addendum)
Please sent Rx to [facility] ASAP. They have 30 mg generic vyvance in stock limited supply

## 2023-01-06 NOTE — Telephone Encounter (Signed)
Ok to pend.

## 2023-01-06 NOTE — Telephone Encounter (Signed)
Please review

## 2023-01-06 NOTE — Telephone Encounter (Signed)
Pended.

## 2023-01-06 NOTE — Telephone Encounter (Signed)
Omari called at 9:45 to report that the pharmacy does not have the vyvanse and doesn't expect to get it anytime soon.  He can't find it anywhere.  He wants to just go back to the Adderall.  [facility] in [facility] [address] [place], [place]  they have only 60 pills so hopes it can get sent in so he can get what they have.  Please let him to know if this sent in so he know to go get it.  Call [phone].  This is his girlfriends phone so LM and she will tell him.  He doesn't have his phone right now. He has appt 2/2 and wants to discuss what medication to take that he can get and will work for him.  He like, everyone else, is frustrated by all this.

## 2023-01-21 ENCOUNTER — Other Ambulatory Visit: Payer: Self-pay

## 2023-01-21 ENCOUNTER — Telehealth: Payer: Self-pay | Admitting: Adult Health

## 2023-01-21 MED ORDER — AMPHETAMINE-DEXTROAMPHETAMINE 30 MG PO TABS: 30.0000 mg | ORAL_TABLET | Freq: Every day | ORAL | 0 refills | Status: DC

## 2023-01-21 NOTE — Telephone Encounter (Signed)
Pt called and would like his adderall IR 30 mg [facility] [address] in [place]. He would like it sent ASAP since they run out of adderall so fast

## 2023-01-21 NOTE — Telephone Encounter (Signed)
Pended.

## 2023-01-24 ENCOUNTER — Ambulatory Visit (INDEPENDENT_AMBULATORY_CARE_PROVIDER_SITE_OTHER): Payer: Self-pay | Admitting: Adult Health

## 2023-01-24 DIAGNOSIS — F489 Nonpsychotic mental disorder, unspecified: Secondary | ICD-10-CM

## 2023-01-24 NOTE — Progress Notes (Signed)
Patient no show appointment. ? ?

## 2023-02-03 ENCOUNTER — Encounter: Payer: Self-pay | Admitting: Adult Health

## 2023-02-03 ENCOUNTER — Ambulatory Visit (INDEPENDENT_AMBULATORY_CARE_PROVIDER_SITE_OTHER): Payer: BC Managed Care – PPO | Admitting: Adult Health

## 2023-02-03 DIAGNOSIS — F3341 Major depressive disorder, recurrent, in partial remission: Secondary | ICD-10-CM

## 2023-02-03 DIAGNOSIS — F902 Attention-deficit hyperactivity disorder, combined type: Secondary | ICD-10-CM | POA: Diagnosis not present

## 2023-02-03 DIAGNOSIS — G4700 Insomnia, unspecified: Secondary | ICD-10-CM | POA: Diagnosis not present

## 2023-02-03 DIAGNOSIS — F422 Mixed obsessional thoughts and acts: Secondary | ICD-10-CM

## 2023-02-03 MED ORDER — DIAZEPAM 10 MG PO TABS: 10.0000 mg | ORAL_TABLET | Freq: Every day | ORAL | 2 refills | Status: DC

## 2023-02-03 MED ORDER — AMPHETAMINE-DEXTROAMPHETAMINE 20 MG PO TABS: 20.0000 mg | ORAL_TABLET | Freq: Two times a day (BID) | ORAL | 0 refills | Status: DC

## 2023-02-03 NOTE — Progress Notes (Signed)
Tristan Mason [id number] 10/31/1996 27 y.o.  Virtual Visit via Telephone Note  I connected with pt on 02/03/23 at  2:40 PM EST by telephone and verified that I am speaking with the correct person using two identifiers.   I discussed the limitations, risks, security and privacy concerns of performing an evaluation and management service by telephone and the availability of in person appointments. I also discussed with the patient that there may be a patient responsible charge related to this service. The patient expressed understanding and agreed to proceed.   I discussed the assessment and treatment plan with the patient. The patient was provided an opportunity to ask questions and all were answered. The patient agreed with the plan and demonstrated an understanding of the instructions.   The patient was advised to call back or seek an in-person evaluation if the symptoms worsen or if the condition fails to improve as anticipated.  I provided 25 minutes of non-face-to-face time during this encounter.  The patient was located at home.  The provider was located at [place].   Aloha Gell, NP   Subjective:   Patient ID:  Tristan Mason is a 27 y.o. (DOB 26-Sep-1996) male.  Chief Complaint: No chief complaint on file.   HPI ARNEL EBEN presents for follow-up of MDD, ADHD, insomnia, and mixed obsessional thoughts and acts.  Describes mood today as "ok". Pleasant. Denies tearfulness. Mood symptoms - decreased depression and anxiety. Increased irritability - "more frustration". Mood is consistent. Stating "I feel like I'm doing alright". Feels like medications are helpful. Stable interest and motivation. Taking medications as prescribed.  Energy levels stable. Active, does not have a regular exercise routine.   Enjoys some usual interests and activities. Lives with girlfriend. Family in [place]. Appetite adequate. Weight gain - 196 pounds. Sleeps well most nights.  Averages 7 to 8 hours.   Focus and concentration stable. Completing tasks. Managing aspects of household. 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 Denies SI or HI.  Denies AH or VH. Denies self harm. Denies substance use.  Previous medication trials: Vyvanse, Adderall IR  Review of Systems:  Review of Systems  Musculoskeletal:  Negative for gait problem.  Neurological:  Negative for tremors.  Psychiatric/Behavioral:         Please refer to HPI    Medications: I have reviewed the patient's current medications.  Current Outpatient Medications  Medication Sig Dispense Refill   amphetamine-dextroamphetamine (ADDERALL) 30 MG tablet Take 1 tablet by mouth daily. 30 tablet 0   diazepam (VALIUM) 10 MG tablet Take 1 tablet (10 mg total) by mouth at bedtime. 30 tablet 2   escitalopram (LEXAPRO) 20 MG tablet Take 1 tablet (20 mg total) by mouth at bedtime. 30 tablet 5   lisdexamfetamine (VYVANSE) 30 MG capsule Take 1 capsule (30 mg total) by mouth daily. 30 capsule 0   lisdexamfetamine (VYVANSE) 40 MG capsule Take 1 capsule (40 mg total) by mouth daily. 30 capsule 0   No current facility-administered medications for this visit.    Medication Side Effects: None  Allergies: No Known Allergies  Past Medical History:  Diagnosis Date   ADHD (attention deficit hyperactivity disorder)    Dyslexia    ODD (oppositional defiant disorder)     No family history on file.  Social History   Socioeconomic History   Marital status: Single    Spouse name: Not on file   Number of children: Not on file   Years of  education: Not on file   Highest education level: Not on file  Occupational History   Not on file  Tobacco Use   Smoking status: Some Days    Types: Cigarettes   Smokeless tobacco: Former  Scientific laboratory technician Use: Every day  Substance and Sexual Activity   Alcohol use: No   Drug use: Yes    Types: Marijuana    Comment: 3 times weekly only on weekend and after 1800 expecting less  when school in session   Sexual activity: Yes    Partners: Female  Other Topics Concern   Not on file  Social History Narrative   Not on file   Social Determinants of Health   Financial Resource Strain: Not on file  Food Insecurity: Not on file  Transportation Needs: Not on file  Physical Activity: Not on file  Stress: Not on file  Social Connections: Not on file  Intimate Partner Violence: Not on file    Past Medical History, Surgical history, Social history, and Family history were reviewed and updated as appropriate.   Please see review of systems for further details on the patient's review from today.   Objective:   Physical Exam:  There were no vitals taken for this visit.  Physical Exam Constitutional:      General: He is not in acute distress. Musculoskeletal:        General: No deformity.  Neurological:     Mental Status: He is alert and oriented to person, place, and time.     Coordination: Coordination normal.  Psychiatric:        Attention and Perception: Attention and perception normal. He does not perceive auditory or visual hallucinations.        Mood and Affect: Mood normal. Mood is not anxious or depressed. Affect is not labile, blunt, angry or inappropriate.        Speech: Speech normal.        Behavior: Behavior normal.        Thought Content: Thought content normal. Thought content is not paranoid or delusional. Thought content does not include homicidal or suicidal ideation. Thought content does not include homicidal or suicidal plan.        Cognition and Memory: Cognition and memory normal.        Judgment: Judgment normal.     Comments: Insight intact     Lab Review:     Component Value Date/Time   NA 137 02/01/2015 0639   K 3.8 02/01/2015 0639   CL 103 02/01/2015 0639   CO2 28 02/01/2015 0639   GLUCOSE 86 02/01/2015 0639   BUN 16 02/01/2015 0639   CREATININE 0.82 02/01/2015 0639   CALCIUM 9.2 02/01/2015 0639   PROT 7.2 02/01/2015 0639    ALBUMIN 4.6 02/01/2015 0639   AST 17 02/01/2015 0639   ALT 13 02/01/2015 0639   ALKPHOS 64 02/01/2015 0639   BILITOT 1.3 (H) 02/01/2015 0639   GFRNONAA NOT CALCULATED 02/01/2015 0639   GFRAA NOT CALCULATED 02/01/2015 0639       Component Value Date/Time   WBC 6.7 02/01/2015 0639   RBC 4.70 02/01/2015 0639   HGB 14.6 02/01/2015 0639   HCT 42.3 02/01/2015 0639   PLT 222 02/01/2015 0639   MCV 90.0 02/01/2015 0639   MCH 31.1 02/01/2015 0639   MCHC 34.5 02/01/2015 0639   RDW 12.5 02/01/2015 0639   LYMPHSABS 1.5 05/20/2014 1645   MONOABS 1.0 05/20/2014 1645   EOSABS 0.1 05/20/2014  1645   BASOSABS 0.0 05/20/2014 1645    No results found for: "POCLITH", "LITHIUM"   No results found for: "PHENYTOIN", "PHENOBARB", "VALPROATE", "CBMZ"   .res Assessment: Plan:    Plan:  PDMP reviewed  Valium 34m at bedtime as needed for sleep Lexapro 246mdaily Add Adderall 2058mID  Monitor BP between visits while taking stimulant medication.   RTC 3 months  Patient advised to contact office with any questions, adverse effects, or acute worsening in signs and symptoms.  Discussed potential benefits, risks, and side effects of stimulants with patient to include increased heart rate, palpitations, insomnia, increased anxiety, increased irritability, or decreased appetite.  Instructed patient to contact office if experiencing any significant tolerability issues.  Discussed potential benefits, risk, and side effects of benzodiazepines to include potential risk of tolerance and dependence, as well as possible drowsiness.  Advised patient not to drive if experiencing drowsiness and to take lowest possible effective dose to minimize risk of dependence and tolerance.  There are no diagnoses linked to this encounter.  Please see After Visit Summary for patient specific instructions.  Future Appointments  Date Time Provider DepRiverdale Park/11/2023  2:40 PM Aayla Marrocco, RegBerdie OgrenP  CP-CP None    No orders of the defined types were placed in this encounter.     -------------------------------

## 2023-02-04 ENCOUNTER — Telehealth: Payer: Self-pay | Admitting: Adult Health

## 2023-02-04 NOTE — Telephone Encounter (Signed)
Festus Holts? At [facility] LVM 2/12 @ 4:28p.  She said pt has a script for Adderall 57m 2 times a day, but he picked up Adderall 388mon 1/30.  She mentioned Vyvanse and that maybe he was prescribed the Adderall in place of the Vyvanse, which they now have, but only in name brand.  I don't think she actually asked a question/issue in her message.  She just said she wanted to make sure she was doing the right thing. Pls call her back.  [phone]

## 2023-02-04 NOTE — Telephone Encounter (Signed)
Yes

## 2023-02-04 NOTE — Telephone Encounter (Signed)
Sent My-Chart message

## 2023-02-04 NOTE — Telephone Encounter (Signed)
Pt lvm that pharmacy will not fill the adderall without a note or call stating it is ok to fill Please call pharmacy

## 2023-02-04 NOTE — Telephone Encounter (Signed)
Last filled 30 XR on 1/30. You wrote Rx yesterday for 20 mg BID.  Is it okay to authorize early refill?

## 2023-02-05 NOTE — Telephone Encounter (Signed)
Spoke with pharmacist again today and asked them to fill the 20 mg Adderall today and she said she would. I told her she could void all previous prescriptions for stimulants.

## 2023-02-05 NOTE — Telephone Encounter (Signed)
Spoke to [place] at 8:50a.  He got the [employer], but the pharmacy wouldn't accept that.  Pls call the pharmacy and/or send them the approval from [place].  Then pls call [place] to let him know it's fixed.  He said he's about 20 mins away from the pharmacy and he doesn't want to keep driving there for it and they won't give it to him.  He is anxious and starting to feel very frustrated.  He understands it's not our fault.  No upcoming appt scheduled.

## 2023-03-04 ENCOUNTER — Other Ambulatory Visit: Payer: Self-pay

## 2023-03-04 ENCOUNTER — Telehealth: Payer: Self-pay | Admitting: Adult Health

## 2023-03-04 DIAGNOSIS — F902 Attention-deficit hyperactivity disorder, combined type: Secondary | ICD-10-CM

## 2023-03-04 NOTE — Telephone Encounter (Signed)
Due 3/13

## 2023-03-04 NOTE — Telephone Encounter (Signed)
Pended.

## 2023-03-04 NOTE — Telephone Encounter (Signed)
Patient called in for refill on Adderall '20mg'$ . Ph: [phone] Appt 5/17 Pharmacy [address] [place]

## 2023-03-05 ENCOUNTER — Other Ambulatory Visit: Payer: Self-pay | Admitting: Adult Health

## 2023-03-05 DIAGNOSIS — F902 Attention-deficit hyperactivity disorder, combined type: Secondary | ICD-10-CM

## 2023-03-05 MED ORDER — AMPHETAMINE-DEXTROAMPHETAMINE 30 MG PO TABS: 30.0000 mg | ORAL_TABLET | Freq: Two times a day (BID) | ORAL | 0 refills | Status: DC

## 2023-03-05 MED ORDER — AMPHETAMINE-DEXTROAMPHETAMINE 20 MG PO TABS: 20.0000 mg | ORAL_TABLET | Freq: Two times a day (BID) | ORAL | 0 refills | Status: DC

## 2023-03-05 NOTE — Telephone Encounter (Signed)
Pt LVM requesting 30 mg generic adderall 2/d. Stated he had tried 20 mg 2/d and 30 mg much more effective. Send to same [facility].

## 2023-03-05 NOTE — Telephone Encounter (Signed)
Script sent  

## 2023-04-04 ENCOUNTER — Other Ambulatory Visit: Payer: Self-pay | Admitting: Adult Health

## 2023-04-04 ENCOUNTER — Telehealth: Payer: Self-pay | Admitting: Adult Health

## 2023-04-04 DIAGNOSIS — G4700 Insomnia, unspecified: Secondary | ICD-10-CM

## 2023-04-04 DIAGNOSIS — F422 Mixed obsessional thoughts and acts: Secondary | ICD-10-CM

## 2023-04-04 DIAGNOSIS — F902 Attention-deficit hyperactivity disorder, combined type: Secondary | ICD-10-CM

## 2023-04-04 DIAGNOSIS — F3341 Major depressive disorder, recurrent, in partial remission: Secondary | ICD-10-CM

## 2023-04-04 MED ORDER — DIAZEPAM 10 MG PO TABS: 10.0000 mg | ORAL_TABLET | Freq: Every day | ORAL | 2 refills | Status: DC

## 2023-04-04 MED ORDER — AMPHETAMINE-DEXTROAMPHETAMINE 30 MG PO TABS: 30.0000 mg | ORAL_TABLET | Freq: Two times a day (BID) | ORAL | 0 refills | Status: DC

## 2023-04-04 NOTE — Telephone Encounter (Signed)
Pt called requesting Rx generic adderall 30 mg 2/d. Stated worked well, dose fine. And Valium 10 mg Rx. Looks like Valium had 2 RF (?) Apt 5/17. [facility] [address] [place].

## 2023-04-04 NOTE — Telephone Encounter (Signed)
Script sent  

## 2023-05-05 ENCOUNTER — Other Ambulatory Visit: Payer: Self-pay | Admitting: Adult Health

## 2023-05-05 ENCOUNTER — Telehealth: Payer: Self-pay | Admitting: Adult Health

## 2023-05-05 DIAGNOSIS — F3341 Major depressive disorder, recurrent, in partial remission: Secondary | ICD-10-CM

## 2023-05-05 DIAGNOSIS — F902 Attention-deficit hyperactivity disorder, combined type: Secondary | ICD-10-CM

## 2023-05-05 DIAGNOSIS — F422 Mixed obsessional thoughts and acts: Secondary | ICD-10-CM

## 2023-05-05 MED ORDER — ESCITALOPRAM OXALATE 20 MG PO TABS: 20.0000 mg | ORAL_TABLET | Freq: Every day | ORAL | 5 refills | Status: DC

## 2023-05-05 MED ORDER — AMPHETAMINE-DEXTROAMPHETAMINE 30 MG PO TABS: 30.0000 mg | ORAL_TABLET | Freq: Two times a day (BID) | ORAL | 0 refills | Status: DC

## 2023-05-05 NOTE — Telephone Encounter (Signed)
Pt called and needs a refill on his lexapro 20 mg and his adderall 30 mg. The pharmacy [facility] [address]. Next appt 5/17

## 2023-05-05 NOTE — Telephone Encounter (Signed)
Scripts sent

## 2023-05-09 ENCOUNTER — Ambulatory Visit (INDEPENDENT_AMBULATORY_CARE_PROVIDER_SITE_OTHER): Payer: BC Managed Care – PPO | Admitting: Adult Health

## 2023-05-09 DIAGNOSIS — Z0389 Encounter for observation for other suspected diseases and conditions ruled out: Secondary | ICD-10-CM

## 2023-05-09 NOTE — Progress Notes (Signed)
Patient no show appointment. ? ?

## 2023-06-04 ENCOUNTER — Telehealth: Payer: Self-pay | Admitting: Adult Health

## 2023-06-04 ENCOUNTER — Other Ambulatory Visit: Payer: Self-pay | Admitting: Adult Health

## 2023-06-04 DIAGNOSIS — F902 Attention-deficit hyperactivity disorder, combined type: Secondary | ICD-10-CM

## 2023-06-04 MED ORDER — AMPHETAMINE-DEXTROAMPHETAMINE 30 MG PO TABS: 30.0000 mg | ORAL_TABLET | Freq: Two times a day (BID) | ORAL | 0 refills | Status: DC

## 2023-06-04 NOTE — Telephone Encounter (Signed)
Next appt is 06/10/23. Requesting Adderall 30 mg called to:  [facility] #[id number] [place], [place] - [address] AT [facility] [address]   Phone: [phone]  Fax: [phone]    Per patient it is in stock.

## 2023-06-04 NOTE — Telephone Encounter (Signed)
Script sent  

## 2023-06-10 ENCOUNTER — Ambulatory Visit (INDEPENDENT_AMBULATORY_CARE_PROVIDER_SITE_OTHER): Payer: Self-pay | Admitting: Adult Health

## 2023-06-10 DIAGNOSIS — Z0389 Encounter for observation for other suspected diseases and conditions ruled out: Secondary | ICD-10-CM

## 2023-06-10 NOTE — Progress Notes (Signed)
Patient no show appointment. ? ?

## 2023-07-04 ENCOUNTER — Other Ambulatory Visit: Payer: Self-pay

## 2023-07-04 ENCOUNTER — Telehealth: Payer: Self-pay | Admitting: Adult Health

## 2023-07-04 DIAGNOSIS — G4700 Insomnia, unspecified: Secondary | ICD-10-CM

## 2023-07-04 DIAGNOSIS — F902 Attention-deficit hyperactivity disorder, combined type: Secondary | ICD-10-CM

## 2023-07-04 DIAGNOSIS — F3341 Major depressive disorder, recurrent, in partial remission: Secondary | ICD-10-CM

## 2023-07-04 DIAGNOSIS — F422 Mixed obsessional thoughts and acts: Secondary | ICD-10-CM

## 2023-07-04 MED ORDER — AMPHETAMINE-DEXTROAMPHETAMINE 30 MG PO TABS
30.0000 mg | ORAL_TABLET | Freq: Two times a day (BID) | ORAL | 0 refills | Status: DC
Start: 2023-07-04 — End: 2023-07-10

## 2023-07-04 MED ORDER — DIAZEPAM 10 MG PO TABS
10.0000 mg | ORAL_TABLET | Freq: Every day | ORAL | 0 refills | Status: DC
Start: 2023-07-04 — End: 2023-07-11

## 2023-07-04 NOTE — Telephone Encounter (Signed)
Pt called and said that he needs a refill on his adderall 30 mg and his valium 10 mg. Pharmacy is [facility] [address] in [place]

## 2023-07-04 NOTE — Telephone Encounter (Signed)
Pended enough to appt. Last 2 visits were no shows.

## 2023-07-08 ENCOUNTER — Ambulatory Visit (INDEPENDENT_AMBULATORY_CARE_PROVIDER_SITE_OTHER): Payer: Self-pay | Admitting: Adult Health

## 2023-07-08 DIAGNOSIS — Z0389 Encounter for observation for other suspected diseases and conditions ruled out: Secondary | ICD-10-CM

## 2023-07-08 NOTE — Progress Notes (Signed)
Patient no show appointment. ? ?

## 2023-07-10 ENCOUNTER — Other Ambulatory Visit: Payer: Self-pay | Admitting: Adult Health

## 2023-07-10 DIAGNOSIS — F902 Attention-deficit hyperactivity disorder, combined type: Secondary | ICD-10-CM

## 2023-07-10 NOTE — Telephone Encounter (Signed)
Pended.

## 2023-07-10 NOTE — Telephone Encounter (Signed)
Pt called and made an appt for 7/23. He needs his adderall 30 mg refilled to the [facility] [address] in [place]

## 2023-07-11 ENCOUNTER — Other Ambulatory Visit: Payer: Self-pay

## 2023-07-11 DIAGNOSIS — F3341 Major depressive disorder, recurrent, in partial remission: Secondary | ICD-10-CM

## 2023-07-11 DIAGNOSIS — G4700 Insomnia, unspecified: Secondary | ICD-10-CM

## 2023-07-11 DIAGNOSIS — F422 Mixed obsessional thoughts and acts: Secondary | ICD-10-CM

## 2023-07-11 MED ORDER — AMPHETAMINE-DEXTROAMPHETAMINE 30 MG PO TABS: 30.0000 mg | ORAL_TABLET | Freq: Two times a day (BID) | ORAL | 0 refills | Status: DC

## 2023-07-11 MED ORDER — DIAZEPAM 10 MG PO TABS: 10.0000 mg | ORAL_TABLET | Freq: Every day | ORAL | 0 refills | Status: DC

## 2023-07-15 ENCOUNTER — Telehealth: Payer: Self-pay | Admitting: Adult Health

## 2023-07-15 ENCOUNTER — Ambulatory Visit (INDEPENDENT_AMBULATORY_CARE_PROVIDER_SITE_OTHER): Payer: Self-pay | Admitting: Adult Health

## 2023-07-15 DIAGNOSIS — Z0389 Encounter for observation for other suspected diseases and conditions ruled out: Secondary | ICD-10-CM

## 2023-07-15 NOTE — Telephone Encounter (Signed)
Patient has had multiple no shows. No Rx until he has an appt.

## 2023-07-15 NOTE — Progress Notes (Signed)
Patient no show appointment. ? ?

## 2023-07-15 NOTE — Telephone Encounter (Signed)
Pt called @ 9:10 apt was 8:20. Asking to change to virtual apt. Advised missed apt was at 8:20. Emergency room with his dog. Pt ask how can apt be at 8:20 you don't open until 9:00. Advised open @ 8 phones on at 9. RS to 7/25  Requesting Rx until apt for generic adderall. Advised probably not, but I'll ask.

## 2023-07-17 ENCOUNTER — Telehealth (INDEPENDENT_AMBULATORY_CARE_PROVIDER_SITE_OTHER): Payer: BC Managed Care – PPO | Admitting: Adult Health

## 2023-07-17 ENCOUNTER — Encounter: Payer: Self-pay | Admitting: Adult Health

## 2023-07-17 DIAGNOSIS — G4700 Insomnia, unspecified: Secondary | ICD-10-CM

## 2023-07-17 DIAGNOSIS — F902 Attention-deficit hyperactivity disorder, combined type: Secondary | ICD-10-CM | POA: Diagnosis not present

## 2023-07-17 DIAGNOSIS — F422 Mixed obsessional thoughts and acts: Secondary | ICD-10-CM

## 2023-07-17 DIAGNOSIS — F3341 Major depressive disorder, recurrent, in partial remission: Secondary | ICD-10-CM | POA: Diagnosis not present

## 2023-07-17 MED ORDER — ESCITALOPRAM OXALATE 20 MG PO TABS
20.0000 mg | ORAL_TABLET | Freq: Every day | ORAL | 5 refills | Status: DC
Start: 2023-07-17 — End: 2023-11-24

## 2023-07-17 MED ORDER — DIAZEPAM 10 MG PO TABS
10.0000 mg | ORAL_TABLET | Freq: Every day | ORAL | 2 refills | Status: DC
Start: 2023-07-17 — End: 2023-08-14

## 2023-07-17 MED ORDER — AMPHETAMINE-DEXTROAMPHETAMINE 30 MG PO TABS: 30.0000 mg | ORAL_TABLET | Freq: Two times a day (BID) | ORAL | 0 refills | Status: DC

## 2023-07-17 NOTE — Progress Notes (Signed)
Tristan Mason [id number] 27-Jun-1996 27 y.o.  Virtual Visit via Video Note  I connected with pt @ on 07/17/23 at  2:00 PM EDT by a video enabled telemedicine application and verified that I am speaking with the correct person using two identifiers.   I discussed the limitations of evaluation and management by telemedicine and the availability of in person appointments. The patient expressed understanding and agreed to proceed.  I discussed the assessment and treatment plan with the patient. The patient was provided an opportunity to ask questions and all were answered. The patient agreed with the plan and demonstrated an understanding of the instructions.   The patient was advised to call back or seek an in-person evaluation if the symptoms worsen or if the condition fails to improve as anticipated.  I provided 25 minutes of non-face-to-face time during this encounter.  The patient was located at home.  The provider was located at [facility].   Dorothyann Gibbs, NP   Subjective:   Patient ID:  Tristan Mason is a 27 y.o. (DOB Jun 03, 1996) male.  Chief Complaint: No chief complaint on file.   HPI Tristan Mason presents for follow-up of MDD, ADHD, insomnia, and mixed obsessional thoughts and acts.  Describes mood today as "ok". Pleasant. Denies tearfulness. Mood symptoms - decreased depression, anxiety, and    irritability.. Mood is consistent. Stating "I feel like I'm doing good". Feels like medications are helpful. Stable interest and motivation. Taking medications as prescribed.  Energy levels stable. Active, does not have a regular exercise routine.   Enjoys some usual interests and activities. Lives with girlfriend. Family in [place]. Appetite adequate. Weight gain - 196 to 205 pounds. Sleeps well most nights. Averages 7 to 8 hours.   Focus and concentration stable. Completing tasks. Managing aspects of household. Working at lab at [employer]. Denies SI or HI.  Denies  AH or VH. Denies self harm. Denies substance use.  Previous medication trials: Vyvanse, Adderall IR    Review of Systems:  Review of Systems  Musculoskeletal:  Negative for gait problem.  Neurological:  Negative for tremors.  Psychiatric/Behavioral:         Please refer to HPI    Medications: I have reviewed the patient's current medications.  Current Outpatient Medications  Medication Sig Dispense Refill   amphetamine-dextroamphetamine (ADDERALL) 30 MG tablet Take 1 tablet by mouth 2 (two) times daily. 10 tablet 0   diazepam (VALIUM) 10 MG tablet Take 1 tablet (10 mg total) by mouth at bedtime. 5 tablet 0   escitalopram (LEXAPRO) 20 MG tablet Take 1 tablet (20 mg total) by mouth at bedtime. 30 tablet 5   No current facility-administered medications for this visit.    Medication Side Effects: None  Allergies: No Known Allergies  Past Medical History:  Diagnosis Date   ADHD (attention deficit hyperactivity disorder)    Dyslexia    ODD (oppositional defiant disorder)     No family history on file.  Social History   Socioeconomic History   Marital status: Single    Spouse name: Not on file   Number of children: Not on file   Years of education: Not on file   Highest education level: Not on file  Occupational History   Not on file  Tobacco Use   Smoking status: Some Days    Types: Cigarettes   Smokeless tobacco: Former  Psychologist, educational Use   Vaping status: Every Day  Substance and Sexual Activity   Alcohol use:  No   Drug use: Yes    Types: Marijuana    Comment: 3 times weekly only on weekend and after 1800 expecting less when school in session   Sexual activity: Yes    Partners: Female  Other Topics Concern   Not on file  Social History Narrative   Not on file   Social Determinants of Health   Financial Resource Strain: Not on file  Food Insecurity: Not on file  Transportation Needs: Not on file  Physical Activity: Not on file  Stress: Not on file  Social  Connections: Unknown (02/19/2023)   Received from [facility]   Social Network    Social Network: Not on file  Intimate Partner Violence: Unknown (02/19/2023)   Received from [facility], [facility]   HITS    Physically Hurt: Not on file    Insult or Talk Down To: Not on file    Threaten Physical Harm: Not on file    Scream or Curse: Not on file    Past Medical History, Surgical history, Social history, and Family history were reviewed and updated as appropriate.   Please see review of systems for further details on the patient's review from today.   Objective:   Physical Exam:  There were no vitals taken for this visit.  Physical Exam Constitutional:      General: He is not in acute distress. Musculoskeletal:        General: No deformity.  Neurological:     Mental Status: He is alert and oriented to person, place, and time.     Coordination: Coordination normal.  Psychiatric:        Attention and Perception: Attention and perception normal. He does not perceive auditory or visual hallucinations.        Mood and Affect: Mood normal. Mood is not anxious or depressed. Affect is not labile, blunt, angry or inappropriate.        Speech: Speech normal.        Behavior: Behavior normal.        Thought Content: Thought content normal. Thought content is not paranoid or delusional. Thought content does not include homicidal or suicidal ideation. Thought content does not include homicidal or suicidal plan.        Cognition and Memory: Cognition and memory normal.        Judgment: Judgment normal.     Comments: Insight intact     Lab Review:     Component Value Date/Time   NA 137 02/01/2015 0639   K 3.8 02/01/2015 0639   CL 103 02/01/2015 0639   CO2 28 02/01/2015 0639   GLUCOSE 86 02/01/2015 0639   BUN 16 02/01/2015 0639   CREATININE 0.82 02/01/2015 0639   CALCIUM 9.2 02/01/2015 0639   PROT 7.2 02/01/2015 0639   ALBUMIN 4.6 02/01/2015 0639   AST 17  02/01/2015 0639   ALT 13 02/01/2015 0639   ALKPHOS 64 02/01/2015 0639   BILITOT 1.3 (H) 02/01/2015 0639   GFRNONAA NOT CALCULATED 02/01/2015 0639   GFRAA NOT CALCULATED 02/01/2015 0639       Component Value Date/Time   WBC 6.7 02/01/2015 0639   RBC 4.70 02/01/2015 0639   HGB 14.6 02/01/2015 0639   HCT 42.3 02/01/2015 0639   PLT 222 02/01/2015 0639   MCV 90.0 02/01/2015 0639   MCH 31.1 02/01/2015 0639   MCHC 34.5 02/01/2015 0639   RDW 12.5 02/01/2015 0639   LYMPHSABS 1.5 05/20/2014 1645  MONOABS 1.0 05/20/2014 1645   EOSABS 0.1 05/20/2014 1645   BASOSABS 0.0 05/20/2014 1645    No results found for: "POCLITH", "LITHIUM"   No results found for: "PHENYTOIN", "PHENOBARB", "VALPROATE", "CBMZ"   .res Assessment: Plan:    Plan:  PDMP reviewed  Valium 10mg  at bedtime as needed for sleep Lexapro 20mg  daily Adderall 30mg  BID  Monitor BP between visits while taking stimulant medication.   RTC 3 months  Patient advised to contact office with any questions, adverse effects, or acute worsening in signs and symptoms.  Discussed potential benefits, risks, and side effects of stimulants with patient to include increased heart rate, palpitations, insomnia, increased anxiety, increased irritability, or decreased appetite.  Instructed patient to contact office if experiencing any significant tolerability issues.  Discussed potential benefits, risk, and side effects of benzodiazepines to include potential risk of tolerance and dependence, as well as possible drowsiness.  Advised patient not to drive if experiencing drowsiness and to take lowest possible effective dose to minimize risk of dependence and tolerance.  There are no diagnoses linked to this encounter.   Please see After Visit Summary for patient specific instructions.  No future appointments.  No orders of the defined types were placed in this encounter.     -------------------------------

## 2023-08-14 ENCOUNTER — Other Ambulatory Visit: Payer: Self-pay

## 2023-08-14 ENCOUNTER — Telehealth: Payer: Self-pay | Admitting: Adult Health

## 2023-08-14 DIAGNOSIS — G4700 Insomnia, unspecified: Secondary | ICD-10-CM

## 2023-08-14 DIAGNOSIS — F902 Attention-deficit hyperactivity disorder, combined type: Secondary | ICD-10-CM

## 2023-08-14 DIAGNOSIS — F422 Mixed obsessional thoughts and acts: Secondary | ICD-10-CM

## 2023-08-14 DIAGNOSIS — F3341 Major depressive disorder, recurrent, in partial remission: Secondary | ICD-10-CM

## 2023-08-14 MED ORDER — DIAZEPAM 10 MG PO TABS
10.0000 mg | ORAL_TABLET | Freq: Every day | ORAL | 1 refills | Status: DC
Start: 2023-08-14 — End: 2023-11-14

## 2023-08-14 MED ORDER — AMPHETAMINE-DEXTROAMPHETAMINE 30 MG PO TABS
30.0000 mg | ORAL_TABLET | Freq: Two times a day (BID) | ORAL | 0 refills | Status: DC
Start: 2023-08-14 — End: 2023-09-15

## 2023-08-14 NOTE — Telephone Encounter (Signed)
Pended.

## 2023-08-14 NOTE — Telephone Encounter (Signed)
Patient called in for refill on Valium 10mg  and Adderall 30mg . Ph: [phone] 2217 [facility] [address]  [place], [place]

## 2023-09-15 ENCOUNTER — Other Ambulatory Visit: Payer: Self-pay

## 2023-09-15 ENCOUNTER — Telehealth: Payer: Self-pay | Admitting: Adult Health

## 2023-09-15 DIAGNOSIS — F902 Attention-deficit hyperactivity disorder, combined type: Secondary | ICD-10-CM

## 2023-09-15 MED ORDER — AMPHETAMINE-DEXTROAMPHETAMINE 30 MG PO TABS: 30.0000 mg | ORAL_TABLET | Freq: Two times a day (BID) | ORAL | 0 refills | Status: DC

## 2023-09-15 NOTE — Telephone Encounter (Signed)
PENDED

## 2023-09-15 NOTE — Telephone Encounter (Signed)
Next visit is 10/16/23. Requesting refill on Adderall XR 30 mg called to:  [facility] #[id number] [place], [place] - [address] AT [facility] [address]   Phone: [phone]  Fax: [phone]    Per patient its in stock

## 2023-10-14 ENCOUNTER — Telehealth: Payer: Self-pay | Admitting: Adult Health

## 2023-10-14 ENCOUNTER — Other Ambulatory Visit: Payer: Self-pay

## 2023-10-14 DIAGNOSIS — F902 Attention-deficit hyperactivity disorder, combined type: Secondary | ICD-10-CM

## 2023-10-14 MED ORDER — AMPHETAMINE-DEXTROAMPHETAMINE 30 MG PO TABS: 30.0000 mg | ORAL_TABLET | Freq: Two times a day (BID) | ORAL | 0 refills | Status: DC

## 2023-10-14 NOTE — Telephone Encounter (Signed)
LVM to rtc regarding medications that were sent in.

## 2023-10-14 NOTE — Telephone Encounter (Signed)
Requesting refill on Adderall IR 30 mg and Valium 10 mg called to:  [facility] #[id number] [place], [place] - [address] AT [facility] OF [address]   Phone: [phone]  Fax: [phone]

## 2023-10-14 NOTE — Telephone Encounter (Signed)
ADDERALL PENDED; DIAZEPAM NOT LF 09/29

## 2023-10-14 NOTE — Telephone Encounter (Signed)
Next visit is 10/16/23.

## 2023-10-16 ENCOUNTER — Ambulatory Visit (INDEPENDENT_AMBULATORY_CARE_PROVIDER_SITE_OTHER): Payer: Self-pay | Admitting: Adult Health

## 2023-10-16 DIAGNOSIS — Z0389 Encounter for observation for other suspected diseases and conditions ruled out: Secondary | ICD-10-CM

## 2023-10-16 NOTE — Progress Notes (Signed)
Patient no show appointment. ? ?

## 2023-11-13 ENCOUNTER — Telehealth: Payer: Self-pay | Admitting: Adult Health

## 2023-11-13 NOTE — Telephone Encounter (Signed)
Patient was a no show for last appt. Please call to schedule. He needs appt in order to get a RF.

## 2023-11-13 NOTE — Telephone Encounter (Signed)
Patient called in for refill on Adderall 30mg . Ph: [phone] 2217 [facility] [address] [place], [place]

## 2023-11-14 ENCOUNTER — Other Ambulatory Visit: Payer: Self-pay

## 2023-11-14 DIAGNOSIS — G4700 Insomnia, unspecified: Secondary | ICD-10-CM

## 2023-11-14 DIAGNOSIS — F902 Attention-deficit hyperactivity disorder, combined type: Secondary | ICD-10-CM

## 2023-11-14 DIAGNOSIS — F3341 Major depressive disorder, recurrent, in partial remission: Secondary | ICD-10-CM

## 2023-11-14 DIAGNOSIS — F422 Mixed obsessional thoughts and acts: Secondary | ICD-10-CM

## 2023-11-14 MED ORDER — AMPHETAMINE-DEXTROAMPHETAMINE 30 MG PO TABS: 30.0000 mg | ORAL_TABLET | Freq: Two times a day (BID) | ORAL | 0 refills | Status: DC

## 2023-11-14 MED ORDER — DIAZEPAM 10 MG PO TABS: 10.0000 mg | ORAL_TABLET | Freq: Every day | ORAL | 0 refills | Status: DC

## 2023-11-14 NOTE — Telephone Encounter (Signed)
Pt made appt for Dec 2nd. He would like Adderall and Valium sent in please.

## 2023-11-14 NOTE — Telephone Encounter (Signed)
Pended enough to appt for Adderall and diazepam to requested pharmacy.

## 2023-11-24 ENCOUNTER — Telehealth: Payer: BC Managed Care – PPO | Admitting: Adult Health

## 2023-11-24 ENCOUNTER — Encounter: Payer: Self-pay | Admitting: Adult Health

## 2023-11-24 DIAGNOSIS — G4700 Insomnia, unspecified: Secondary | ICD-10-CM

## 2023-11-24 DIAGNOSIS — F422 Mixed obsessional thoughts and acts: Secondary | ICD-10-CM | POA: Diagnosis not present

## 2023-11-24 DIAGNOSIS — F902 Attention-deficit hyperactivity disorder, combined type: Secondary | ICD-10-CM | POA: Diagnosis not present

## 2023-11-24 DIAGNOSIS — F3341 Major depressive disorder, recurrent, in partial remission: Secondary | ICD-10-CM

## 2023-11-24 MED ORDER — DIAZEPAM 10 MG PO TABS: 10.0000 mg | ORAL_TABLET | Freq: Every day | ORAL | 2 refills | Status: DC

## 2023-11-24 MED ORDER — ESCITALOPRAM OXALATE 20 MG PO TABS: 20.0000 mg | ORAL_TABLET | Freq: Every day | ORAL | 5 refills | Status: DC

## 2023-11-24 MED ORDER — AMPHETAMINE-DEXTROAMPHETAMINE 30 MG PO TABS: 30.0000 mg | ORAL_TABLET | Freq: Two times a day (BID) | ORAL | 0 refills | Status: DC

## 2023-11-24 NOTE — Progress Notes (Signed)
TOSHIAKI TISBY [id number] [birth year] 27 y.o.  Virtual Visit via Video Note  I connected with pt @ on 11/24/23 at  3:40 PM EST by a video enabled telemedicine application and verified that I am speaking with the correct person using two identifiers.   I discussed the limitations of evaluation and management by telemedicine and the availability of in person appointments. The patient expressed understanding and agreed to proceed.  I discussed the assessment and treatment plan with the patient. The patient was provided an opportunity to ask questions and all were answered. The patient agreed with the plan and demonstrated an understanding of the instructions.   The patient was advised to call back or seek an in-person evaluation if the symptoms worsen or if the condition fails to improve as anticipated.  I provided 25 minutes of non-face-to-face time during this encounter.  The patient was located at home.  The provider was located at [facility].   Dorothyann Gibbs, NP   Subjective:   Patient ID:  Tristan Mason is a 27 y.o. (DOB [birth year]) male.  Chief Complaint: No chief complaint on file.   HPI Tristan Mason presents for follow-up of MDD, ADHD, insomnia, and mixed obsessional thoughts and acts.  Describes mood today as "ok". Pleasant. Denies tearfulness. Mood symptoms - reports some existential depression with loss of sister. Denies anxiety and irritability. Denies panic attacks. Reports some night terrors 3 days a week. Denies worry and rumination. Reports some over thinking - research. Mood is consistent. Stating "I feel like I'm doing alright". Feels like medications are helpful. Stable interest and motivation. Taking medications as prescribed.  Energy levels stable. Active, does not have a regular exercise routine.   Enjoys some usual interests and activities. Lives with girlfriend. Family in [place]. Appetite adequate. Weight gain - to 208 pounds. Sleeps well most  nights. Averages 4 to 6 hours after losing sister in October. Planning to get a sleep study. Focus and concentration stable. Completing tasks. Managing aspects of household. Senior at [employer]. Denies SI or HI.  Denies AH or VH. Denies self harm. Denies substance use.  Previous medication trials: Vyvanse, Adderall IR    Review of Systems:  Review of Systems  Musculoskeletal:  Negative for gait problem.  Neurological:  Negative for tremors.  Psychiatric/Behavioral:         Please refer to HPI    Medications: I have reviewed the patient's current medications.  Current Outpatient Medications  Medication Sig Dispense Refill   amphetamine-dextroamphetamine (ADDERALL) 30 MG tablet Take 1 tablet by mouth 2 (two) times daily. 20 tablet 0   diazepam (VALIUM) 10 MG tablet Take 1 tablet (10 mg total) by mouth at bedtime. 10 tablet 0   escitalopram (LEXAPRO) 20 MG tablet Take 1 tablet (20 mg total) by mouth at bedtime. 30 tablet 5   No current facility-administered medications for this visit.    Medication Side Effects: None  Allergies: No Known Allergies  Past Medical History:  Diagnosis Date   ADHD (attention deficit hyperactivity disorder)    Dyslexia    ODD (oppositional defiant disorder)     No family history on file.  Social History   Socioeconomic History   Marital status: Single    Spouse name: Not on file   Number of children: Not on file   Years of education: Not on file   Highest education level: Not on file  Occupational History   Not on file  Tobacco Use   Smoking  status: Some Days    Types: Cigarettes   Smokeless tobacco: Former  Building services engineer status: Every Day  Substance and Sexual Activity   Alcohol use: No   Drug use: Yes    Types: Marijuana    Comment: 3 times weekly only on weekend and after 1800 expecting less when school in session   Sexual activity: Yes    Partners: Female  Other Topics Concern   Not on file  Social History Narrative    Not on file   Social Determinants of Health   Financial Resource Strain: Not on file  Food Insecurity: Not on file  Transportation Needs: Not on file  Physical Activity: Not on file  Stress: Not on file  Social Connections: Unknown (02/19/2023)   Received from [facility]   Social Network    Social Network: Not on file  Intimate Partner Violence: Unknown (02/19/2023)   Received from [facility], [facility]   HITS    Physically Hurt: Not on file    Insult or Talk Down To: Not on file    Threaten Physical Harm: Not on file    Scream or Curse: Not on file    Past Medical History, Surgical history, Social history, and Family history were reviewed and updated as appropriate.   Please see review of systems for further details on the patient's review from today.   Objective:   Physical Exam:  There were no vitals taken for this visit.  Physical Exam Constitutional:      General: He is not in acute distress. Musculoskeletal:        General: No deformity.  Neurological:     Mental Status: He is alert and oriented to person, place, and time.     Coordination: Coordination normal.  Psychiatric:        Attention and Perception: Attention and perception normal. He does not perceive auditory or visual hallucinations.        Mood and Affect: Affect is not labile, blunt, angry or inappropriate.        Speech: Speech normal.        Behavior: Behavior normal.        Thought Content: Thought content normal. Thought content is not paranoid or delusional. Thought content does not include homicidal or suicidal ideation. Thought content does not include homicidal or suicidal plan.        Cognition and Memory: Cognition and memory normal.        Judgment: Judgment normal.     Comments: Insight intact     Lab Review:     Component Value Date/Time   NA 137 02/01/2015 0639   K 3.8 02/01/2015 0639   CL 103 02/01/2015 0639   CO2 28 02/01/2015 0639   GLUCOSE 86  02/01/2015 0639   BUN 16 02/01/2015 0639   CREATININE 0.82 02/01/2015 0639   CALCIUM 9.2 02/01/2015 0639   PROT 7.2 02/01/2015 0639   ALBUMIN 4.6 02/01/2015 0639   AST 17 02/01/2015 0639   ALT 13 02/01/2015 0639   ALKPHOS 64 02/01/2015 0639   BILITOT 1.3 (H) 02/01/2015 0639   GFRNONAA NOT CALCULATED 02/01/2015 0639   GFRAA NOT CALCULATED 02/01/2015 0639       Component Value Date/Time   WBC 6.7 02/01/2015 0639   RBC 4.70 02/01/2015 0639   HGB 14.6 02/01/2015 0639   HCT 42.3 02/01/2015 0639   PLT 222 02/01/2015 0639   MCV 90.0 02/01/2015 0639   MCH  31.1 02/01/2015 0639   MCHC 34.5 02/01/2015 0639   RDW 12.5 02/01/2015 0639   LYMPHSABS 1.5 05/20/2014 1645   MONOABS 1.0 05/20/2014 1645   EOSABS 0.1 05/20/2014 1645   BASOSABS 0.0 05/20/2014 1645    No results found for: "POCLITH", "LITHIUM"   No results found for: "PHENYTOIN", "PHENOBARB", "VALPROATE", "CBMZ"   .res Assessment: Plan:    Plan:  PDMP reviewed  Valium 10mg  at bedtime as needed for sleep Lexapro 20mg  daily Adderall 30mg  BID  Monitor BP between visits while taking stimulant medication.   RTC 3 months  Patient advised to contact office with any questions, adverse effects, or acute worsening in signs and symptoms.  Discussed potential benefits, risks, and side effects of stimulants with patient to include increased heart rate, palpitations, insomnia, increased anxiety, increased irritability, or decreased appetite.  Instructed patient to contact office if experiencing any significant tolerability issues.  Discussed potential benefits, risk, and side effects of benzodiazepines to include potential risk of tolerance and dependence, as well as possible drowsiness.  Advised patient not to drive if experiencing drowsiness and to take lowest possible effective dose to minimize risk of dependence and tolerance. There are no diagnoses linked to this encounter.   Please see After Visit Summary for patient  specific instructions.  Future Appointments  Date Time Provider Department Center  11/24/2023  3:40 PM Geralynn Capri, Thereasa Solo, NP [facility] None    No orders of the defined types were placed in this encounter.     -------------------------------

## 2024-01-08 ENCOUNTER — Telehealth: Payer: Self-pay | Admitting: Adult Health

## 2024-01-08 ENCOUNTER — Other Ambulatory Visit: Payer: Self-pay

## 2024-01-08 DIAGNOSIS — F902 Attention-deficit hyperactivity disorder, combined type: Secondary | ICD-10-CM

## 2024-01-08 MED ORDER — AMPHETAMINE-DEXTROAMPHETAMINE 30 MG PO TABS: 30.0000 mg | ORAL_TABLET | Freq: Two times a day (BID) | ORAL | 0 refills | Status: DC

## 2024-01-08 NOTE — Telephone Encounter (Signed)
PT lvm that he needs a refill on his adderall 30 mg. Pharmacy is [facility] [address] in [place]

## 2024-01-08 NOTE — Telephone Encounter (Signed)
Pended adderall 30 mg lf 12/4 to rqsted pharmacy.

## 2024-02-09 ENCOUNTER — Telehealth: Payer: Self-pay | Admitting: Adult Health

## 2024-02-09 ENCOUNTER — Other Ambulatory Visit: Payer: Self-pay

## 2024-02-09 DIAGNOSIS — F902 Attention-deficit hyperactivity disorder, combined type: Secondary | ICD-10-CM

## 2024-02-09 MED ORDER — AMPHETAMINE-DEXTROAMPHETAMINE 30 MG PO TABS: 30.0000 mg | ORAL_TABLET | Freq: Two times a day (BID) | ORAL | 0 refills | Status: DC

## 2024-02-09 NOTE — Telephone Encounter (Signed)
Pt has RF available for diazepam, pended Adderall 30 mg x2 to [facility] in [place].

## 2024-02-09 NOTE — Telephone Encounter (Signed)
Patient called in for refill on Adderall 30mg  and Diazepam 10mg . Ph: [phone] 2217 [facility]acy [facility] [address] [place]

## 2024-03-08 ENCOUNTER — Telehealth: Payer: Self-pay | Admitting: Adult Health

## 2024-03-08 ENCOUNTER — Other Ambulatory Visit: Payer: Self-pay

## 2024-03-08 DIAGNOSIS — G4700 Insomnia, unspecified: Secondary | ICD-10-CM

## 2024-03-08 DIAGNOSIS — F902 Attention-deficit hyperactivity disorder, combined type: Secondary | ICD-10-CM

## 2024-03-08 DIAGNOSIS — F3341 Major depressive disorder, recurrent, in partial remission: Secondary | ICD-10-CM

## 2024-03-08 DIAGNOSIS — F422 Mixed obsessional thoughts and acts: Secondary | ICD-10-CM

## 2024-03-08 NOTE — Telephone Encounter (Signed)
 Pended Adderall and diazepam to [facility].

## 2024-03-08 NOTE — Telephone Encounter (Signed)
 Pt called and has an appt 03/17/24. He needs a refill on his adderall 30 mg and his valium 10 mg. Pharmacy is [facility] [address] in [place]

## 2024-03-09 MED ORDER — DIAZEPAM 10 MG PO TABS: 10.0000 mg | ORAL_TABLET | Freq: Every day | ORAL | 0 refills | Status: DC

## 2024-03-09 MED ORDER — AMPHETAMINE-DEXTROAMPHETAMINE 30 MG PO TABS: 30.0000 mg | ORAL_TABLET | Freq: Two times a day (BID) | ORAL | 0 refills | Status: DC

## 2024-03-17 ENCOUNTER — Telehealth: Admitting: Adult Health

## 2024-04-08 ENCOUNTER — Telehealth: Payer: Self-pay | Admitting: Adult Health

## 2024-04-08 ENCOUNTER — Other Ambulatory Visit: Payer: Self-pay | Admitting: Adult Health

## 2024-04-08 DIAGNOSIS — F3341 Major depressive disorder, recurrent, in partial remission: Secondary | ICD-10-CM

## 2024-04-08 DIAGNOSIS — F902 Attention-deficit hyperactivity disorder, combined type: Secondary | ICD-10-CM

## 2024-04-08 DIAGNOSIS — G4700 Insomnia, unspecified: Secondary | ICD-10-CM

## 2024-04-08 DIAGNOSIS — F422 Mixed obsessional thoughts and acts: Secondary | ICD-10-CM

## 2024-04-08 MED ORDER — AMPHETAMINE-DEXTROAMPHETAMINE 30 MG PO TABS: 30.0000 mg | ORAL_TABLET | Freq: Two times a day (BID) | ORAL | 0 refills | Status: DC

## 2024-04-08 MED ORDER — DIAZEPAM 10 MG PO TABS: 10.0000 mg | ORAL_TABLET | Freq: Every day | ORAL | 0 refills | Status: DC

## 2024-04-08 NOTE — Telephone Encounter (Signed)
 Patient called in for refill on Valim 10mg  and Adderall 30mg . Ph: [phone] 2217 Pharmacy [facility] [address] [place]

## 2024-04-08 NOTE — Telephone Encounter (Signed)
 Please call to schedule FU. He was due in March. Needs appt to get controlled medications.

## 2024-04-08 NOTE — Telephone Encounter (Signed)
 Patient called back into office stating that he needs medication sent ASAP as he is going out of town for [place]. Ph: [phone] appt 4/29

## 2024-04-08 NOTE — Telephone Encounter (Signed)
 Patient scheduled appt 4/29.

## 2024-04-20 ENCOUNTER — Ambulatory Visit (INDEPENDENT_AMBULATORY_CARE_PROVIDER_SITE_OTHER): Payer: Self-pay | Admitting: Adult Health

## 2024-04-20 DIAGNOSIS — Z0389 Encounter for observation for other suspected diseases and conditions ruled out: Secondary | ICD-10-CM

## 2024-04-20 NOTE — Progress Notes (Deleted)
 Tristan Mason [id number] Dec 03, 1996 28 y.o.  Virtual Visit via Telephone Note  I connected with pt on 04/20/24 at  1:00 PM EDT by telephone and verified that I am speaking with the correct person using two identifiers.   I discussed the limitations, risks, security and privacy concerns of performing an evaluation and management service by telephone and the availability of in person appointments. I also discussed with the patient that there may be a patient responsible charge related to this service. The patient expressed understanding and agreed to proceed.   I discussed the assessment and treatment plan with the patient. The patient was provided an opportunity to ask questions and all were answered. The patient agreed with the plan and demonstrated an understanding of the instructions.   The patient was advised to call back or seek an in-person evaluation if the symptoms worsen or if the condition fails to improve as anticipated.  I provided *** minutes of non-face-to-face time during this encounter.  The patient was located at home.  The provider was located at [facility].   Reagan Camera, NP   Subjective:   Patient ID:  Tristan Mason is a 28 y.o. (DOB 30-Sep-1996) male.  Chief Complaint: No chief complaint on file.   HPI LINNIE MCCLAY presents for follow-up of MDD, ADHD, insomnia, and mixed obsessional thoughts and acts.  Describes mood today as "ok". Pleasant. Denies tearfulness. Mood symptoms - reports some existential depression with loss of sister. Denies anxiety and irritability. Denies panic attacks. Reports some night terrors 3 days a week. Denies worry and rumination. Reports some over thinking - research. Mood is consistent. Stating "I feel like I'm doing alright". Feels like medications are helpful. Stable interest and motivation. Taking medications as prescribed.  Energy levels stable. Active, does not have a regular exercise routine.   Enjoys some usual  interests and activities. Lives with girlfriend. Family in [place]. Appetite adequate. Weight gain - to 208 pounds. Sleeps well most nights. Averages 4 to 6 hours after losing sister in October. Planning to get a sleep study. Focus and concentration stable. Completing tasks. Managing aspects of household. Senior at [employer]. Denies SI or HI.  Denies AH or VH. Denies self harm. Denies substance use.  Previous medication trials: Vyvanse , Adderall IR   Review of Systems:  Review of Systems  Medications: {medication reviewed/display:3041432}  Current Outpatient Medications  Medication Sig Dispense Refill   amphetamine -dextroamphetamine  (ADDERALL) 30 MG tablet Take 1 tablet by mouth 2 (two) times daily. 60 tablet 0   diazepam  (VALIUM ) 10 MG tablet Take 1 tablet (10 mg total) by mouth at bedtime. 30 tablet 0   escitalopram  (LEXAPRO ) 20 MG tablet Take 1 tablet (20 mg total) by mouth at bedtime. 30 tablet 5   No current facility-administered medications for this visit.    Medication Side Effects: {Medication Side Effects (Optional):21014029}  Allergies: No Known Allergies  Past Medical History:  Diagnosis Date   ADHD (attention deficit hyperactivity disorder)    Dyslexia    ODD (oppositional defiant disorder)     No family history on file.  Social History   Socioeconomic History   Marital status: Single    Spouse name: Not on file   Number of children: Not on file   Years of education: Not on file   Highest education level: Not on file  Occupational History   Not on file  Tobacco Use   Smoking status: Some Days    Types: Cigarettes  Smokeless tobacco: Former  Building services engineer status: Every Day  Substance and Sexual Activity   Alcohol use: No   Drug use: Yes    Types: Marijuana    Comment: 3 times weekly only on weekend and after 1800 expecting less when school in session   Sexual activity: Yes    Partners: Female  Other Topics Concern   Not on file  Social  History Narrative   Not on file   Social Drivers of Health   Financial Resource Strain: Not on file  Food Insecurity: Not on file  Transportation Needs: Not on file  Physical Activity: Not on file  Stress: Not on file  Social Connections: Unknown (02/19/2023)   Received from [facility], [facility]   Social Network    Social Network: Not on file  Intimate Partner Violence: Unknown (02/19/2023)   Received from [facility], [facility]   HITS    Physically Hurt: Not on file    Insult or Talk Down To: Not on file    Threaten Physical Harm: Not on file    Scream or Curse: Not on file    Past Medical History, Surgical history, Social history, and Family history were reviewed and updated as appropriate.   Please see review of systems for further details on the patient's review from today.   Objective:   Physical Exam:  There were no vitals taken for this visit.  Physical Exam  Lab Review:     Component Value Date/Time   NA 137 02/01/2015 0639   K 3.8 02/01/2015 0639   CL 103 02/01/2015 0639   CO2 28 02/01/2015 0639   GLUCOSE 86 02/01/2015 0639   BUN 16 02/01/2015 0639   CREATININE 0.82 02/01/2015 0639   CALCIUM 9.2 02/01/2015 0639   PROT 7.2 02/01/2015 0639   ALBUMIN 4.6 02/01/2015 0639   AST 17 02/01/2015 0639   ALT 13 02/01/2015 0639   ALKPHOS 64 02/01/2015 0639   BILITOT 1.3 (H) 02/01/2015 0639   GFRNONAA NOT CALCULATED 02/01/2015 0639   GFRAA NOT CALCULATED 02/01/2015 0639       Component Value Date/Time   WBC 6.7 02/01/2015 0639   RBC 4.70 02/01/2015 0639   HGB 14.6 02/01/2015 0639   HCT 42.3 02/01/2015 0639   PLT 222 02/01/2015 0639   MCV 90.0 02/01/2015 0639   MCH 31.1 02/01/2015 0639   MCHC 34.5 02/01/2015 0639   RDW 12.5 02/01/2015 0639   LYMPHSABS 1.5 05/20/2014 1645   MONOABS 1.0 05/20/2014 1645   EOSABS 0.1 05/20/2014 1645   BASOSABS 0.0 05/20/2014 1645    No results found for: "POCLITH", "LITHIUM"   No results found for:  "PHENYTOIN", "PHENOBARB", "VALPROATE", "CBMZ"   .res Assessment: Plan:    Plan:  PDMP reviewed  Valium  10mg  at bedtime as needed for sleep Lexapro  20mg  daily Adderall 30mg  BID  Monitor BP between visits while taking stimulant medication.   RTC 3 months  Patient advised to contact office with any questions, adverse effects, or acute worsening in signs and symptoms.  Discussed potential benefits, risks, and side effects of stimulants with patient to include increased heart rate, palpitations, insomnia, increased anxiety, increased irritability, or decreased appetite.  Instructed patient to contact office if experiencing any significant tolerability issues.  Discussed potential benefits, risk, and side effects of benzodiazepines to include potential risk of tolerance and dependence, as well as possible drowsiness.  Advised patient not to drive if experiencing drowsiness and to take lowest possible  effective dose to minimize risk of dependence and tolerance. There are no diagnoses linked to this encounter.  Please see After Visit Summary for patient specific instructions.  Future Appointments  Date Time Provider Department Center  04/20/2024  1:00 PM Leahanna Buser Nattal[place], NP [facility] None    No orders of the defined types were placed in this encounter.     -------------------------------

## 2024-04-20 NOTE — Progress Notes (Signed)
 Patient no show appointment. ? ?

## 2024-05-10 ENCOUNTER — Telehealth: Payer: Self-pay | Admitting: Adult Health

## 2024-05-10 NOTE — Telephone Encounter (Signed)
 Dimissal letter dated 4/29. Per Trenia Fritter phone # on schedule for last visit was not correct. You have previously done telehealth visits with pt so we did have correct phone # on chart. Not sure what # you used. Letter said we could fill for 2 mo so this refill request is not affected, but wanted to make you aware of the wrong # on schedule in case you were using that #, and that would make a difference in his dismissal.

## 2024-05-10 NOTE — Telephone Encounter (Signed)
 Tristan Mason called at 3:20 to set an appointment and to request refills of his medications.  I told him due to no shows he had been dismissed from the practice.  He was sorry it missed last appt. He had a change in phone #.  The # we had in the notes was mistyped.  # should have been [phone].  So you did have the correct # to call.  But I told him I could not schedule and that I would explain what happened.  He does refills on his medications, Lexapro , Valium  and Adderall.  Send to  [id number] - [place], [place] - [address] AT [facility] [address]   If you have a change of mind about giving him another chance, let us  know and we can call to RS.  I have corrected the # in the system.

## 2024-05-11 NOTE — Telephone Encounter (Signed)
 Per Bonnell Butcher, schedule pt.  Meds will be pended today.

## 2024-05-12 ENCOUNTER — Other Ambulatory Visit: Payer: Self-pay

## 2024-05-12 DIAGNOSIS — F422 Mixed obsessional thoughts and acts: Secondary | ICD-10-CM

## 2024-05-12 DIAGNOSIS — G4700 Insomnia, unspecified: Secondary | ICD-10-CM

## 2024-05-12 DIAGNOSIS — F902 Attention-deficit hyperactivity disorder, combined type: Secondary | ICD-10-CM

## 2024-05-12 DIAGNOSIS — F3341 Major depressive disorder, recurrent, in partial remission: Secondary | ICD-10-CM

## 2024-05-12 MED ORDER — DIAZEPAM 10 MG PO TABS: 10.0000 mg | ORAL_TABLET | Freq: Every day | ORAL | 0 refills | Status: DC

## 2024-05-12 MED ORDER — AMPHETAMINE-DEXTROAMPHETAMINE 30 MG PO TABS: 30.0000 mg | ORAL_TABLET | Freq: Two times a day (BID) | ORAL | 0 refills | Status: DC

## 2024-05-20 ENCOUNTER — Ambulatory Visit: Payer: Self-pay | Admitting: Adult Health

## 2024-05-20 ENCOUNTER — Encounter: Payer: Self-pay | Admitting: Adult Health

## 2024-05-20 DIAGNOSIS — F902 Attention-deficit hyperactivity disorder, combined type: Secondary | ICD-10-CM

## 2024-05-20 DIAGNOSIS — F3341 Major depressive disorder, recurrent, in partial remission: Secondary | ICD-10-CM | POA: Diagnosis not present

## 2024-05-20 DIAGNOSIS — F422 Mixed obsessional thoughts and acts: Secondary | ICD-10-CM | POA: Diagnosis not present

## 2024-05-20 DIAGNOSIS — G4700 Insomnia, unspecified: Secondary | ICD-10-CM

## 2024-05-20 NOTE — Progress Notes (Signed)
 Iker W Hands [id number] 1996-10-09 28 y.o.  Virtual Visit via Telephone Note  I connected with pt on 05/20/24 at 11:30 AM EDT by telephone and verified that I am speaking with the correct person using two identifiers.   I discussed the limitations, risks, security and privacy concerns of performing an evaluation and management service by telephone and the availability of in person appointments. I also discussed with the patient that there may be a patient responsible charge related to this service. The patient expressed understanding and agreed to proceed.   I discussed the assessment and treatment plan with the patient. The patient was provided an opportunity to ask questions and all were answered. The patient agreed with the plan and demonstrated an understanding of the instructions.   The patient was advised to call back or seek an in-person evaluation if the symptoms worsen or if the condition fails to improve as anticipated.  I provided 25 minutes of non-face-to-face time during this encounter.  The patient was located at home.  The provider was located at [facility].   Reagan Camera, NP   Subjective:   Patient ID:  Tristan Mason is a 28 y.o. (DOB October 16, 1996) male.  Chief Complaint: No chief complaint on file.   HPI Tristan Mason presents for follow-up of MDD, ADHD, insomnia, and mixed obsessional thoughts and acts.  Describes mood today as "ok". Pleasant. Denies tearfulness. Mood symptoms - reports some existential depression with loss of sister. Denies anxiety and irritability. Denies panic attacks. Reports some night terrors 3 days a week. Denies worry and rumination. Reports some over thinking - research. Mood is consistent. Stating "I feel like I'm doing alright". Feels like medications are helpful. Stable interest and motivation. Taking medications as prescribed.  Energy levels stable. Active, does not have a regular exercise routine.   Enjoys some usual  interests and activities. Lives with girlfriend. Family in [place]. Appetite adequate. Weight gain - to 208 pounds. Sleeps well most nights. Averages 4 to 6 hours after losing sister in October. Planning to get a sleep study. Focus and concentration stable. Completing tasks. Managing aspects of household. Senior at [employer]. Denies SI or HI.  Denies AH or VH. Denies self harm. Denies substance use.  Previous medication trials: Vyvanse , Adderall IR   Review of Systems:  Review of Systems  Musculoskeletal:  Negative for gait problem.  Neurological:  Negative for tremors.  Psychiatric/Behavioral:         Please refer to HPI    Medications: I have reviewed the patient's current medications.  Current Outpatient Medications  Medication Sig Dispense Refill   amphetamine -dextroamphetamine  (ADDERALL) 30 MG tablet Take 1 tablet by mouth 2 (two) times daily. 60 tablet 0   diazepam  (VALIUM ) 10 MG tablet Take 1 tablet (10 mg total) by mouth at bedtime. 30 tablet 0   escitalopram  (LEXAPRO ) 20 MG tablet Take 1 tablet (20 mg total) by mouth at bedtime. 30 tablet 5   No current facility-administered medications for this visit.    Medication Side Effects: None  Allergies: No Known Allergies  Past Medical History:  Diagnosis Date   ADHD (attention deficit hyperactivity disorder)    Dyslexia    ODD (oppositional defiant disorder)     No family history on file.  Social History   Socioeconomic History   Marital status: Single    Spouse name: Not on file   Number of children: Not on file   Years of education: Not on file  Highest education level: Not on file  Occupational History   Not on file  Tobacco Use   Smoking status: Some Days    Types: Cigarettes   Smokeless tobacco: Former  Advertising account planner   Vaping status: Every Day  Substance and Sexual Activity   Alcohol use: No   Drug use: Yes    Types: Marijuana    Comment: 3 times weekly only on weekend and after 1800 expecting less  when school in session   Sexual activity: Yes    Partners: Female  Other Topics Concern   Not on file  Social History Narrative   Not on file   Social Drivers of Health   Financial Resource Strain: Not on file  Food Insecurity: Not on file  Transportation Needs: Not on file  Physical Activity: Not on file  Stress: Not on file  Social Connections: Unknown (02/19/2023)   Received from [facility], [facility]   Social Network    Social Network: Not on file  Intimate Partner Violence: Unknown (02/19/2023)   Received from [facility], [facility]   HITS    Physically Hurt: Not on file    Insult or Talk Down To: Not on file    Threaten Physical Harm: Not on file    Scream or Curse: Not on file    Past Medical History, Surgical history, Social history, and Family history were reviewed and updated as appropriate.   Please see review of systems for further details on the patient's review from today.   Objective:   Physical Exam:  There were no vitals taken for this visit.  Physical Exam Constitutional:      General: He is not in acute distress. Musculoskeletal:        General: No deformity.  Neurological:     Mental Status: He is alert and oriented to person, place, and time.     Cranial Nerves: No dysarthria.     Coordination: Coordination normal.  Psychiatric:        Attention and Perception: Attention and perception normal. He does not perceive auditory or visual hallucinations.        Mood and Affect: Mood normal. Mood is not anxious or depressed. Affect is not labile, blunt, angry or inappropriate.        Speech: Speech normal.        Behavior: Behavior normal. Behavior is cooperative.        Thought Content: Thought content normal. Thought content is not paranoid or delusional. Thought content does not include homicidal or suicidal ideation. Thought content does not include homicidal or suicidal plan.        Cognition and Memory: Cognition and memory normal.         Judgment: Judgment normal.     Comments: Insight intact     Lab Review:     Component Value Date/Time   NA 137 02/01/2015 0639   K 3.8 02/01/2015 0639   CL 103 02/01/2015 0639   CO2 28 02/01/2015 0639   GLUCOSE 86 02/01/2015 0639   BUN 16 02/01/2015 0639   CREATININE 0.82 02/01/2015 0639   CALCIUM 9.2 02/01/2015 0639   PROT 7.2 02/01/2015 0639   ALBUMIN 4.6 02/01/2015 0639   AST 17 02/01/2015 0639   ALT 13 02/01/2015 0639   ALKPHOS 64 02/01/2015 0639   BILITOT 1.3 (H) 02/01/2015 0639   GFRNONAA NOT CALCULATED 02/01/2015 0639   GFRAA NOT CALCULATED 02/01/2015 0639       Component Value Date/Time  WBC 6.7 02/01/2015 0639   RBC 4.70 02/01/2015 0639   HGB 14.6 02/01/2015 0639   HCT 42.3 02/01/2015 0639   PLT 222 02/01/2015 0639   MCV 90.0 02/01/2015 0639   MCH 31.1 02/01/2015 0639   MCHC 34.5 02/01/2015 0639   RDW 12.5 02/01/2015 0639   LYMPHSABS 1.5 05/20/2014 1645   MONOABS 1.0 05/20/2014 1645   EOSABS 0.1 05/20/2014 1645   BASOSABS 0.0 05/20/2014 1645    No results found for: "POCLITH", "LITHIUM"   No results found for: "PHENYTOIN", "PHENOBARB", "VALPROATE", "CBMZ"   .res Assessment: Plan:    Plan:  PDMP reviewed  Valium  10mg  at bedtime as needed for sleep Lexapro  20mg  daily Adderall 30mg  BID  Monitor BP between visits while taking stimulant medication.   RTC 3 months  25 minutes spent dedicated to the care of this patient on the date of this encounter to include pre-visit review of records, ordering of medication, post visit documentation, and face-to-face time with the patient discussing MDD, ADHD, insomnia, and mixed obsessional thoughts and acts. Discussed continuing current medication regimen.  Patient advised to contact office with any questions, adverse effects, or acute worsening in signs and symptoms.  Discussed potential benefits, risks, and side effects of stimulants with patient to include increased heart rate, palpitations, insomnia,  increased anxiety, increased irritability, or decreased appetite.  Instructed patient to contact office if experiencing any significant tolerability issues.  Discussed potential benefits, risk, and side effects of benzodiazepines to include potential risk of tolerance and dependence, as well as possible drowsiness.  Advised patient not to drive if experiencing drowsiness and to take lowest possible effective dose to minimize risk of dependence and tolerance.  Diagnoses and all orders for this visit:  Recurrent major depression in partial remission (HCC)  Mixed obsessional thoughts and acts  Attention deficit hyperactivity disorder (ADHD), combined type, moderate  Insomnia, unspecified type    Please see After Visit Summary for patient specific instructions.  No future appointments.   No orders of the defined types were placed in this encounter.     -------------------------------

## 2024-06-10 ENCOUNTER — Telehealth: Payer: Self-pay | Admitting: Adult Health

## 2024-06-10 ENCOUNTER — Other Ambulatory Visit: Payer: Self-pay

## 2024-06-10 DIAGNOSIS — F902 Attention-deficit hyperactivity disorder, combined type: Secondary | ICD-10-CM

## 2024-06-10 MED ORDER — AMPHETAMINE-DEXTROAMPHETAMINE 30 MG PO TABS
30.0000 mg | ORAL_TABLET | Freq: Two times a day (BID) | ORAL | 0 refills | Status: DC
Start: 2024-08-05 — End: 2024-09-09

## 2024-06-10 MED ORDER — AMPHETAMINE-DEXTROAMPHETAMINE 30 MG PO TABS
30.0000 mg | ORAL_TABLET | Freq: Two times a day (BID) | ORAL | 0 refills | Status: AC
Start: 2024-06-10 — End: ?

## 2024-06-10 MED ORDER — AMPHETAMINE-DEXTROAMPHETAMINE 30 MG PO TABS
30.0000 mg | ORAL_TABLET | Freq: Two times a day (BID) | ORAL | 0 refills | Status: DC
Start: 2024-07-08 — End: 2024-10-18

## 2024-06-10 NOTE — Telephone Encounter (Signed)
 Pt called in at 2:07p req refill on his Adderall.   Sent to  [id number] - [place], [place] - [address] AT [facility] [address]

## 2024-06-10 NOTE — Telephone Encounter (Signed)
Pended Adderall 

## 2024-07-09 ENCOUNTER — Other Ambulatory Visit: Payer: Self-pay

## 2024-07-09 ENCOUNTER — Telehealth: Payer: Self-pay | Admitting: Adult Health

## 2024-07-09 DIAGNOSIS — G4700 Insomnia, unspecified: Secondary | ICD-10-CM

## 2024-07-09 DIAGNOSIS — F3341 Major depressive disorder, recurrent, in partial remission: Secondary | ICD-10-CM

## 2024-07-09 DIAGNOSIS — F422 Mixed obsessional thoughts and acts: Secondary | ICD-10-CM

## 2024-07-09 MED ORDER — DIAZEPAM 10 MG PO TABS
10.0000 mg | ORAL_TABLET | Freq: Every day | ORAL | 0 refills | Status: DC
Start: 2024-07-09 — End: 2024-08-09

## 2024-07-09 NOTE — Telephone Encounter (Signed)
 Pt already had an Adderall RF for 7/17. Pended diazepam .

## 2024-07-09 NOTE — Telephone Encounter (Signed)
 Pt called to schedule f/u. Apt 8/29. Requesting Rx Valium  & Adderall. [facility] [address] [employer]

## 2024-08-09 ENCOUNTER — Other Ambulatory Visit: Payer: Self-pay

## 2024-08-09 ENCOUNTER — Telehealth: Payer: Self-pay | Admitting: Adult Health

## 2024-08-09 DIAGNOSIS — F3341 Major depressive disorder, recurrent, in partial remission: Secondary | ICD-10-CM

## 2024-08-09 DIAGNOSIS — F422 Mixed obsessional thoughts and acts: Secondary | ICD-10-CM

## 2024-08-09 DIAGNOSIS — G4700 Insomnia, unspecified: Secondary | ICD-10-CM

## 2024-08-09 MED ORDER — DIAZEPAM 10 MG PO TABS: 10.0000 mg | ORAL_TABLET | Freq: Every day | ORAL | 0 refills | Status: DC

## 2024-08-09 NOTE — Telephone Encounter (Signed)
 Pt needs refill on Valium    Next appt 8/29     [facility] [address]  [place]

## 2024-08-09 NOTE — Telephone Encounter (Signed)
 Pended

## 2024-08-20 ENCOUNTER — Encounter: Payer: Self-pay | Admitting: Adult Health

## 2024-08-20 ENCOUNTER — Ambulatory Visit: Payer: Self-pay | Admitting: Adult Health

## 2024-08-20 DIAGNOSIS — F3341 Major depressive disorder, recurrent, in partial remission: Secondary | ICD-10-CM

## 2024-08-20 DIAGNOSIS — F422 Mixed obsessional thoughts and acts: Secondary | ICD-10-CM

## 2024-08-20 DIAGNOSIS — F902 Attention-deficit hyperactivity disorder, combined type: Secondary | ICD-10-CM

## 2024-08-20 DIAGNOSIS — F329 Major depressive disorder, single episode, unspecified: Secondary | ICD-10-CM

## 2024-08-20 DIAGNOSIS — F909 Attention-deficit hyperactivity disorder, unspecified type: Secondary | ICD-10-CM

## 2024-08-20 DIAGNOSIS — G4700 Insomnia, unspecified: Secondary | ICD-10-CM

## 2024-08-20 MED ORDER — ESCITALOPRAM OXALATE 20 MG PO TABS: 20.0000 mg | ORAL_TABLET | Freq: Every day | ORAL | 5 refills | Status: DC

## 2024-08-20 MED ORDER — ZOLPIDEM TARTRATE 10 MG PO TABS
10.0000 mg | ORAL_TABLET | Freq: Every evening | ORAL | 0 refills | Status: DC | PRN
Start: 2024-08-20 — End: 2024-09-09

## 2024-08-20 NOTE — Progress Notes (Signed)
 Tristan Mason [id number] 08-29-1996 28 y.o.  Virtual Visit via Telephone Note  I connected with pt on 08/20/24 at  8:30 AM EDT by telephone and verified that I am speaking with the correct person using two identifiers.   I discussed the limitations, risks, security and privacy concerns of performing an evaluation and management service by telephone and the availability of in person appointments. I also discussed with the patient that there may be a patient responsible charge related to this service. The patient expressed understanding and agreed to proceed.   I discussed the assessment and treatment plan with the patient. The patient was provided an opportunity to ask questions and all were answered. The patient agreed with the plan and demonstrated an understanding of the instructions.   The patient was advised to call back or seek an in-person evaluation if the symptoms worsen or if the condition fails to improve as anticipated.  I provided 25 minutes of non-face-to-face time during this encounter.  The patient was located at home.  The provider was located at [facility].   Tristan LOISE Sayers, NP   Subjective:   Patient ID:  Tristan Mason is a 28 y.o. (DOB 1996/08/06) male.  Chief Complaint: No chief complaint on file.   HPI Tristan Mason presents for follow-up of MDD, ADHD, insomnia, and mixed obsessional thoughts and acts.  Describes mood today as ok. Pleasant. Denies tearfulness. Mood symptoms - reports some depression and anxiety. Reports stable interest and motivation. Denies irritability. Denies panic attacks. Denies worry and rumination. Reports some over thinking - research. Reports mood is consistent. Stating I feel like I'm doing pretty good. Feels like medications are helpful. Taking medications as prescribed.  Energy levels stable. Active, walking daily - current goal is 5,000 steps. Enjoys some usual interests and activities. Lives with girlfriend. Family  in [place]. Appetite adequate. Weight loss - 190 pounds. Sleeping better some nights than others. Reports sleep paralysis 3 times a week. Averages 7 hours of broken sleep.  Focus and concentration stable. Completing tasks. Managing aspects of household. Student at [employer]. Denies SI or HI.  Denies AH or VH. Denies self harm. Denies substance use.  Previous medication trials: Vyvanse , Adderall IR   Review of Systems:  Review of Systems  Musculoskeletal:  Negative for gait problem.  Neurological:  Negative for tremors.  Psychiatric/Behavioral:         Please refer to HPI    Medications: I have reviewed the patient's current medications.  Current Outpatient Medications  Medication Sig Dispense Refill   amphetamine -dextroamphetamine  (ADDERALL) 30 MG tablet Take 1 tablet by mouth 2 (two) times daily. 60 tablet 0   amphetamine -dextroamphetamine  (ADDERALL) 30 MG tablet Take 1 tablet by mouth 2 (two) times daily. 60 tablet 0   amphetamine -dextroamphetamine  (ADDERALL) 30 MG tablet Take 1 tablet by mouth 2 (two) times daily. 60 tablet 0   diazepam  (VALIUM ) 10 MG tablet Take 1 tablet (10 mg total) by mouth at bedtime. 30 tablet 0   escitalopram  (LEXAPRO ) 20 MG tablet Take 1 tablet (20 mg total) by mouth at bedtime. 30 tablet 5   No current facility-administered medications for this visit.    Medication Side Effects: None  Allergies: No Known Allergies  Past Medical History:  Diagnosis Date   ADHD (attention deficit hyperactivity disorder)    Dyslexia    ODD (oppositional defiant disorder)     No family history on file.  Social History   Socioeconomic History   Marital status:  Single    Spouse name: Not on file   Number of children: Not on file   Years of education: Not on file   Highest education level: Not on file  Occupational History   Not on file  Tobacco Use   Smoking status: Some Days    Types: Cigarettes   Smokeless tobacco: Former  Psychologist, educational Use   Vaping  status: Every Day  Substance and Sexual Activity   Alcohol use: No   Drug use: Yes    Types: Marijuana    Comment: 3 times weekly only on weekend and after 1800 expecting less when school in session   Sexual activity: Yes    Partners: Female  Other Topics Concern   Not on file  Social History Narrative   Not on file   Social Drivers of Health   Financial Resource Strain: Not on file  Food Insecurity: Not on file  Transportation Needs: Not on file  Physical Activity: Not on file  Stress: Not on file  Social Connections: Unknown (02/19/2023)   Received from [facility]   Social Network    Social Network: Not on file  Intimate Partner Violence: Unknown (02/19/2023)   Received from [facility]   HITS    Physically Hurt: Not on file    Insult or Talk Down To: Not on file    Threaten Physical Harm: Not on file    Scream or Curse: Not on file    Past Medical History, Surgical history, Social history, and Family history were reviewed and updated as appropriate.   Please see review of systems for further details on the patient's review from today.   Objective:   Physical Exam:  There were no vitals taken for this visit.  Physical Exam Constitutional:      General: He is not in acute distress. Musculoskeletal:        General: No deformity.  Neurological:     Mental Status: He is alert and oriented to person, place, and time.     Coordination: Coordination normal.  Psychiatric:        Attention and Perception: Attention and perception normal. He does not perceive auditory or visual hallucinations.        Mood and Affect: Mood normal. Mood is not anxious or depressed. Affect is not labile, blunt, angry or inappropriate.        Speech: Speech normal.        Behavior: Behavior normal.        Thought Content: Thought content normal. Thought content is not paranoid or delusional. Thought content does not include homicidal or suicidal ideation. Thought content does not include  homicidal or suicidal plan.        Cognition and Memory: Cognition and memory normal.        Judgment: Judgment normal.     Comments: Insight intact     Lab Review:     Component Value Date/Time   NA 137 02/01/2015 0639   K 3.8 02/01/2015 0639   CL 103 02/01/2015 0639   CO2 28 02/01/2015 0639   GLUCOSE 86 02/01/2015 0639   BUN 16 02/01/2015 0639   CREATININE 0.82 02/01/2015 0639   CALCIUM 9.2 02/01/2015 0639   PROT 7.2 02/01/2015 0639   ALBUMIN 4.6 02/01/2015 0639   AST 17 02/01/2015 0639   ALT 13 02/01/2015 0639   ALKPHOS 64 02/01/2015 0639   BILITOT 1.3 (H) 02/01/2015 0639   GFRNONAA NOT CALCULATED 02/01/2015 0639   GFRAA NOT  CALCULATED 02/01/2015 0639       Component Value Date/Time   WBC 6.7 02/01/2015 0639   RBC 4.70 02/01/2015 0639   HGB 14.6 02/01/2015 0639   HCT 42.3 02/01/2015 0639   PLT 222 02/01/2015 0639   MCV 90.0 02/01/2015 0639   MCH 31.1 02/01/2015 0639   MCHC 34.5 02/01/2015 0639   RDW 12.5 02/01/2015 0639   LYMPHSABS 1.5 05/20/2014 1645   MONOABS 1.0 05/20/2014 1645   EOSABS 0.1 05/20/2014 1645   BASOSABS 0.0 05/20/2014 1645    No results found for: POCLITH, LITHIUM   No results found for: PHENYTOIN, PHENOBARB, VALPROATE, CBMZ   .res Assessment: Plan:    Plan:  PDMP reviewed  D/C Valium  10mg  at bedtime as needed for sleep Add Ambien  10mg  daily  Lexapro  20mg  daily Adderall 30mg  BID    Will call insurance to see if Vyvanse  or Adderall XR is covered and will call with disposition.  Monitor BP between visits while taking stimulant medication.   RTC 4 weeks  25 minutes spent dedicated to the care of this patient on the date of this encounter to include pre-visit review of records, ordering of medication, post visit documentation, and face-to-face time with the patient discussing MDD, ADHD, insomnia, and mixed obsessional thoughts and acts. Discussed continuing current medication regimen.  Patient advised to contact office  with any questions, adverse effects, or acute worsening in signs and symptoms.  Discussed potential benefits, risks, and side effects of stimulants with patient to include increased heart rate, palpitations, insomnia, increased anxiety, increased irritability, or decreased appetite.  Instructed patient to contact office if experiencing any significant tolerability issues.  Discussed potential benefits, risk, and side effects of benzodiazepines to include potential risk of tolerance and dependence, as well as possible drowsiness. Advised patient not to drive if experiencing drowsiness and to take lowest possible effective dose to minimize risk of dependence and tolerance.  There are no diagnoses linked to this encounter.   Please see After Visit Summary for patient specific instructions.  No future appointments.  No orders of the defined types were placed in this encounter.     -------------------------------

## 2024-09-09 ENCOUNTER — Telehealth: Payer: Self-pay | Admitting: Adult Health

## 2024-09-09 ENCOUNTER — Other Ambulatory Visit: Payer: Self-pay

## 2024-09-09 DIAGNOSIS — G4700 Insomnia, unspecified: Secondary | ICD-10-CM

## 2024-09-09 DIAGNOSIS — F902 Attention-deficit hyperactivity disorder, combined type: Secondary | ICD-10-CM

## 2024-09-09 DIAGNOSIS — F422 Mixed obsessional thoughts and acts: Secondary | ICD-10-CM

## 2024-09-09 DIAGNOSIS — F3341 Major depressive disorder, recurrent, in partial remission: Secondary | ICD-10-CM

## 2024-09-09 MED ORDER — AMPHETAMINE-DEXTROAMPHETAMINE 30 MG PO TABS: 30.0000 mg | ORAL_TABLET | Freq: Two times a day (BID) | ORAL | 0 refills | Status: DC

## 2024-09-09 MED ORDER — ZOLPIDEM TARTRATE 10 MG PO TABS: 10.0000 mg | ORAL_TABLET | Freq: Every evening | ORAL | 0 refills | Status: DC | PRN

## 2024-09-09 NOTE — Telephone Encounter (Signed)
 Pt is wanting refill of Adderall XR and they discussed Ambien  instead of Diazepam . Please cb to discuss.  [facility] [address] [place] [address]

## 2024-09-09 NOTE — Telephone Encounter (Signed)
 Next visit is 10/01/24. Requesting refill for Ambien  10 mg and Adderall IR called to:  [id number] - [place], [place] - [address] AT [facility] OF [address]   Phone: [phone]  Fax: [phone]

## 2024-09-09 NOTE — Telephone Encounter (Addendum)
 Pt wanted Adderall and Ambien .  Pended. He asked Rx be sent to a different pharmacy, not the original one noted.

## 2024-10-01 ENCOUNTER — Telehealth (INDEPENDENT_AMBULATORY_CARE_PROVIDER_SITE_OTHER): Payer: Self-pay | Admitting: Adult Health

## 2024-10-01 DIAGNOSIS — Z0389 Encounter for observation for other suspected diseases and conditions ruled out: Secondary | ICD-10-CM

## 2024-10-01 NOTE — Progress Notes (Signed)
 Patient no show appointment. Did not connect for video appt. Called and was unable to LVM.

## 2024-10-01 NOTE — Progress Notes (Deleted)
 Tristan Mason [id number] 07/11/1996 28 y.o.  Virtual Visit via Video Note  I connected with pt @ on 10/01/24 at  8:30 AM EDT by a video enabled telemedicine application and verified that I am speaking with the correct person using two identifiers.   I discussed the limitations of evaluation and management by telemedicine and the availability of in person appointments. The patient expressed understanding and agreed to proceed.  I discussed the assessment and treatment plan with the patient. The patient was provided an opportunity to ask questions and all were answered. The patient agreed with the plan and demonstrated an understanding of the instructions.   The patient was advised to call back or seek an in-person evaluation if the symptoms worsen or if the condition fails to improve as anticipated.  I provided 26 minutes of non-face-to-face time during this encounter.  The patient was located at home.  The provider was located at [facility].   Angeline LOISE Sayers, NP   Subjective:   Patient ID:  Tristan Mason is a 28 y.o. (DOB 08-19-1996) male.  Chief Complaint: No chief complaint on file.   HPI Tristan Mason presents for follow-up of MDD, ADHD, insomnia, and mixed obsessional thoughts and acts.  Describes mood today as ok. Pleasant. Denies tearfulness. Mood symptoms - reports some depression and anxiety. Reports stable interest and motivation. Denies irritability. Denies panic attacks. Denies worry and rumination. Reports some over thinking - research. Reports mood is consistent. Stating I feel like I'm doing pretty good. Feels like medications are helpful. Taking medications as prescribed.  Energy levels stable. Active, walking daily - current goal is 5,000 steps. Enjoys some usual interests and activities. Lives with girlfriend. Family in [place]. Appetite adequate. Weight loss - 190 pounds. Sleeping better some nights than others. Reports sleep paralysis 3 times  a week. Averages 7 hours of broken sleep.  Focus and concentration stable. Completing tasks. Managing aspects of household. Student at [employer]. Denies SI or HI.  Denies AH or VH. Denies self harm. Denies substance use.  Previous medication trials: Vyvanse , Adderall IR   Review of Systems:  Review of Systems  Medications: {medication reviewed/display:3041432}  Current Outpatient Medications  Medication Sig Dispense Refill   amphetamine -dextroamphetamine  (ADDERALL) 30 MG tablet Take 1 tablet by mouth 2 (two) times daily. 60 tablet 0   amphetamine -dextroamphetamine  (ADDERALL) 30 MG tablet Take 1 tablet by mouth 2 (two) times daily. 60 tablet 0   amphetamine -dextroamphetamine  (ADDERALL) 30 MG tablet Take 1 tablet by mouth 2 (two) times daily. 60 tablet 0   diazepam  (VALIUM ) 10 MG tablet Take 1 tablet (10 mg total) by mouth at bedtime. 30 tablet 0   escitalopram  (LEXAPRO ) 20 MG tablet Take 1 tablet (20 mg total) by mouth at bedtime. 30 tablet 5   zolpidem  (AMBIEN ) 10 MG tablet Take 1 tablet (10 mg total) by mouth at bedtime as needed for sleep. 30 tablet 0   No current facility-administered medications for this visit.    Medication Side Effects: {Medication Side Effects (Optional):21014029}  Allergies: No Known Allergies  Past Medical History:  Diagnosis Date   ADHD (attention deficit hyperactivity disorder)    Dyslexia    ODD (oppositional defiant disorder)     No family history on file.  Social History   Socioeconomic History   Marital status: Single    Spouse name: Not on file   Number of children: Not on file   Years of education: Not on file   Highest education  level: Not on file  Occupational History   Not on file  Tobacco Use   Smoking status: Some Days    Types: Cigarettes   Smokeless tobacco: Former  Advertising account planner   Vaping status: Every Day  Substance and Sexual Activity   Alcohol use: No   Drug use: Yes    Types: Marijuana    Comment: 3 times weekly only on  weekend and after 1800 expecting less when school in session   Sexual activity: Yes    Partners: Female  Other Topics Concern   Not on file  Social History Narrative   Not on file   Social Drivers of Health   Financial Resource Strain: Not on file  Food Insecurity: Not on file  Transportation Needs: Not on file  Physical Activity: Not on file  Stress: Not on file  Social Connections: Unknown (02/19/2023)   Received from [facility]   Social Network    Social Network: Not on file  Intimate Partner Violence: Unknown (02/19/2023)   Received from [facility]   HITS    Physically Hurt: Not on file    Insult or Talk Down To: Not on file    Threaten Physical Harm: Not on file    Scream or Curse: Not on file    Past Medical History, Surgical history, Social history, and Family history were reviewed and updated as appropriate.   Please see review of systems for further details on the patient's review from today.   Objective:   Physical Exam:  There were no vitals taken for this visit.  Physical Exam  Lab Review:     Component Value Date/Time   NA 137 02/01/2015 0639   K 3.8 02/01/2015 0639   CL 103 02/01/2015 0639   CO2 28 02/01/2015 0639   GLUCOSE 86 02/01/2015 0639   BUN 16 02/01/2015 0639   CREATININE 0.82 02/01/2015 0639   CALCIUM 9.2 02/01/2015 0639   PROT 7.2 02/01/2015 0639   ALBUMIN 4.6 02/01/2015 0639   AST 17 02/01/2015 0639   ALT 13 02/01/2015 0639   ALKPHOS 64 02/01/2015 0639   BILITOT 1.3 (H) 02/01/2015 0639   GFRNONAA NOT CALCULATED 02/01/2015 0639   GFRAA NOT CALCULATED 02/01/2015 0639       Component Value Date/Time   WBC 6.7 02/01/2015 0639   RBC 4.70 02/01/2015 0639   HGB 14.6 02/01/2015 0639   HCT 42.3 02/01/2015 0639   PLT 222 02/01/2015 0639   MCV 90.0 02/01/2015 0639   MCH 31.1 02/01/2015 0639   MCHC 34.5 02/01/2015 0639   RDW 12.5 02/01/2015 0639   LYMPHSABS 1.5 05/20/2014 1645   MONOABS 1.0 05/20/2014 1645   EOSABS 0.1  05/20/2014 1645   BASOSABS 0.0 05/20/2014 1645    No results found for: POCLITH, LITHIUM   No results found for: PHENYTOIN, PHENOBARB, VALPROATE, CBMZ   .res Assessment: Plan:    Plan:  PDMP reviewed  D/C Valium  10mg  at bedtime as needed for sleep Add Ambien  10mg  daily  Lexapro  20mg  daily Adderall 30mg  BID    Will call insurance to see if Vyvanse  or Adderall XR is covered and will call with disposition.  Monitor BP between visits while taking stimulant medication.   RTC 4 weeks  25 minutes spent dedicated to the care of this patient on the date of this encounter to include pre-visit review of records, ordering of medication, post visit documentation, and face-to-face time with the patient discussing MDD, ADHD, insomnia, and mixed obsessional thoughts  and acts. Discussed continuing current medication regimen.  Patient advised to contact office with any questions, adverse effects, or acute worsening in signs and symptoms.  Discussed potential benefits, risks, and side effects of stimulants with patient to include increased heart rate, palpitations, insomnia, increased anxiety, increased irritability, or decreased appetite.  Instructed patient to contact office if experiencing any significant tolerability issues.  Discussed potential benefits, risk, and side effects of benzodiazepines to include potential risk of tolerance and dependence, as well as possible drowsiness. Advised patient not to drive if experiencing drowsiness and to take lowest possible effective dose to minimize risk of dependence and tolerance.  There are no diagnoses linked to this encounter.   Please see After Visit Summary for patient specific instructions.  Future Appointments  Date Time Provider Department Center  10/01/2024  8:30 AM Ronica Vivian Nattalie, NP [facility] None    No orders of the defined types were placed in this encounter.     -------------------------------

## 2024-10-08 ENCOUNTER — Other Ambulatory Visit: Payer: Self-pay

## 2024-10-08 ENCOUNTER — Telehealth: Payer: Self-pay | Admitting: Adult Health

## 2024-10-08 DIAGNOSIS — F902 Attention-deficit hyperactivity disorder, combined type: Secondary | ICD-10-CM

## 2024-10-08 DIAGNOSIS — G4700 Insomnia, unspecified: Secondary | ICD-10-CM

## 2024-10-08 MED ORDER — ZOLPIDEM TARTRATE 10 MG PO TABS: 10.0000 mg | ORAL_TABLET | Freq: Every evening | ORAL | 0 refills | Status: DC | PRN

## 2024-10-08 MED ORDER — AMPHETAMINE-DEXTROAMPHETAMINE 30 MG PO TABS: 30.0000 mg | ORAL_TABLET | Freq: Two times a day (BID) | ORAL | 0 refills | Status: DC

## 2024-10-08 NOTE — Telephone Encounter (Signed)
 Pt made another appt for 10/27. He is requesting RF on Adderall and Ambien . To go to: [facility] #[id number] - [place], [place] - [address]

## 2024-10-08 NOTE — Telephone Encounter (Signed)
 Pended

## 2024-10-18 ENCOUNTER — Telehealth: Payer: Self-pay | Admitting: Adult Health

## 2024-10-18 ENCOUNTER — Encounter: Payer: Self-pay | Admitting: Adult Health

## 2024-10-18 DIAGNOSIS — F329 Major depressive disorder, single episode, unspecified: Secondary | ICD-10-CM

## 2024-10-18 DIAGNOSIS — F422 Mixed obsessional thoughts and acts: Secondary | ICD-10-CM

## 2024-10-18 DIAGNOSIS — F902 Attention-deficit hyperactivity disorder, combined type: Secondary | ICD-10-CM

## 2024-10-18 DIAGNOSIS — F909 Attention-deficit hyperactivity disorder, unspecified type: Secondary | ICD-10-CM

## 2024-10-18 DIAGNOSIS — G4700 Insomnia, unspecified: Secondary | ICD-10-CM

## 2024-10-18 DIAGNOSIS — F3341 Major depressive disorder, recurrent, in partial remission: Secondary | ICD-10-CM

## 2024-10-18 MED ORDER — ZOLPIDEM TARTRATE 10 MG PO TABS: 10.0000 mg | ORAL_TABLET | Freq: Every evening | ORAL | 0 refills | Status: DC | PRN

## 2024-10-18 MED ORDER — DIAZEPAM 10 MG PO TABS: 10.0000 mg | ORAL_TABLET | Freq: Every day | ORAL | 0 refills | Status: AC

## 2024-10-18 MED ORDER — AMPHETAMINE-DEXTROAMPHETAMINE 30 MG PO TABS: 30.0000 mg | ORAL_TABLET | Freq: Two times a day (BID) | ORAL | 0 refills | Status: DC

## 2024-10-18 NOTE — Progress Notes (Signed)
 Tristan Mason [id number] [birth year] 28 y.o.  Virtual Visit via Video Note  I connected with pt @ on 10/18/24 at  5:00 PM EDT by a video enabled telemedicine application and verified that I am speaking with the correct person using two identifiers.   I discussed the limitations of evaluation and management by telemedicine and the availability of in person appointments. The patient expressed understanding and agreed to proceed.  I discussed the assessment and treatment plan with the patient. The patient was provided an opportunity to ask questions and all were answered. The patient agreed with the plan and demonstrated an understanding of the instructions.   The patient was advised to call back or seek an in-person evaluation if the symptoms worsen or if the condition fails to improve as anticipated.  I provided 25 minutes of non-face-to-face time during this encounter.  The patient was located at home.  The provider was located at [facility].   Angeline LOISE Sayers, NP   Subjective:   Patient ID:  Tristan Mason is a 28 y.o. (DOB 02-12-1996) male.  Chief Complaint: No chief complaint on file.   HPI Tristan Mason presents for follow-up of MDD, ADHD, insomnia, and mixed obsessional thoughts and acts.  Describes mood today as ok. Pleasant. Denies tearfulness. Mood symptoms - reports some depression a little bit. Denies anxiety. Reports stable interest and motivation - not as strong as I want it to be. Denies irritability. Denies panic attacks. Reports some worry. Denies rumination. Reports some over thinking - not in a bad way. Reports mood is consistent. Stating I feel like I'm doing ok. Feels like medications are helpful. Taking medications as prescribed.  Energy levels stable. Active, walking daily - current goal is 5,000 steps. Enjoys some usual interests and activities. Lives with girlfriend. Family in [place]. Appetite adequate. Weight loss - 190  pounds. Sleeping better some nights than others.  Averages 7 hours of broken sleep. Denies recent sleep paralysis. Focus and concentration stable. Completing tasks. Managing aspects of household. Attends [employer] - finishing up [employer]. Denies SI or HI.  Denies AH or VH. Denies self harm. Denies substance use.  Previous medication trials: Vyvanse , Adderall IR  Review of Systems:  Review of Systems  Musculoskeletal:  Negative for gait problem.  Neurological:  Negative for tremors.  Psychiatric/Behavioral:         Please refer to HPI    Medications: I have reviewed the patient's current medications.  Current Outpatient Medications  Medication Sig Dispense Refill   amphetamine -dextroamphetamine  (ADDERALL) 30 MG tablet Take 1 tablet by mouth 2 (two) times daily. 60 tablet 0   amphetamine -dextroamphetamine  (ADDERALL) 30 MG tablet Take 1 tablet by mouth 2 (two) times daily. 60 tablet 0   amphetamine -dextroamphetamine  (ADDERALL) 30 MG tablet Take 1 tablet by mouth 2 (two) times daily. 60 tablet 0   diazepam  (VALIUM ) 10 MG tablet Take 1 tablet (10 mg total) by mouth at bedtime. 30 tablet 0   escitalopram  (LEXAPRO ) 20 MG tablet Take 1 tablet (20 mg total) by mouth at bedtime. 30 tablet 5   zolpidem  (AMBIEN ) 10 MG tablet Take 1 tablet (10 mg total) by mouth at bedtime as needed for sleep. 30 tablet 0   No current facility-administered medications for this visit.    Medication Side Effects: None  Allergies: No Known Allergies  Past Medical History:  Diagnosis Date   ADHD (attention deficit hyperactivity disorder)    Dyslexia    ODD (oppositional defiant disorder)  No family history on file.  Social History   Socioeconomic History   Marital status: Single    Spouse name: Not on file   Number of children: Not on file   Years of education: Not on file   Highest education level: Not on file  Occupational History   Not on file  Tobacco Use   Smoking status:  Some Days    Types: Cigarettes   Smokeless tobacco: Former  Advertising Account Planner   Vaping status: Every Day  Substance and Sexual Activity   Alcohol use: No   Drug use: Yes    Types: Marijuana    Comment: 3 times weekly only on weekend and after 1800 expecting less when school in session   Sexual activity: Yes    Partners: Female  Other Topics Concern   Not on file  Social History Narrative   Not on file   Social Drivers of Health   Financial Resource Strain: Not on file  Food Insecurity: Not on file  Transportation Needs: Not on file  Physical Activity: Not on file  Stress: Not on file  Social Connections: Unknown (02/19/2023)   [facility]   Social Network    Social Network: Not on file  Intimate Partner Violence: Unknown (02/19/2023)   Received from [facility]   HITS    Physically Hurt: Not on file    Insult or Talk Down To: Not on file    Threaten Physical Harm: Not on file    Scream or Curse: Not on file    Past Medical History, Surgical history, Social history, and Family history were reviewed and updated as appropriate.   Please see review of systems for further details on the patient's review from today.   Objective:   Physical Exam:  There were no vitals taken for this visit.  Physical Exam Constitutional:      General: He is not in acute distress. Musculoskeletal:        General: No deformity.  Neurological:     Mental Status: He is alert and oriented to person, place, and time.     Coordination: Coordination normal.  Psychiatric:        Attention and Perception: Attention and perception normal. He does not perceive auditory or visual hallucinations.        Mood and Affect: Mood normal. Mood is not anxious or depressed. Affect is not labile, blunt, angry or inappropriate.        Speech: Speech normal.        Behavior: Behavior normal.        Thought Content: Thought content normal. Thought content is not paranoid or delusional. Thought  content does not include homicidal or suicidal ideation. Thought content does not include homicidal or suicidal plan.        Cognition and Memory: Cognition and memory normal.        Judgment: Judgment normal.     Comments: Insight intact     Lab Review:     Component Value Date/Time   NA 137 02/01/2015 0639   K 3.8 02/01/2015 0639   CL 103 02/01/2015 0639   CO2 28 02/01/2015 0639   GLUCOSE 86 02/01/2015 0639   BUN 16 02/01/2015 0639   CREATININE 0.82 02/01/2015 0639   CALCIUM 9.2 02/01/2015 0639   PROT 7.2 02/01/2015 0639   ALBUMIN 4.6 02/01/2015 0639   AST 17 02/01/2015 0639   ALT 13 02/01/2015 0639   ALKPHOS 64 02/01/2015 9360  BILITOT 1.3 (H) 02/01/2015 0639   GFRNONAA NOT CALCULATED 02/01/2015 0639   GFRAA NOT CALCULATED 02/01/2015 0639       Component Value Date/Time   WBC 6.7 02/01/2015 0639   RBC 4.70 02/01/2015 0639   HGB 14.6 02/01/2015 0639   HCT 42.3 02/01/2015 0639   PLT 222 02/01/2015 0639   MCV 90.0 02/01/2015 0639   MCH 31.1 02/01/2015 0639   MCHC 34.5 02/01/2015 0639   RDW 12.5 02/01/2015 0639   LYMPHSABS 1.5 05/20/2014 1645   MONOABS 1.0 05/20/2014 1645   EOSABS 0.1 05/20/2014 1645   BASOSABS 0.0 05/20/2014 1645    No results found for: POCLITH, LITHIUM   No results found for: PHENYTOIN, PHENOBARB, VALPROATE, CBMZ   .res Assessment: Plan:    Plan:  PDMP reviewed  Ambien  10mg  daily - taking 1/2 to one tablet Lexapro  20mg  daily Adderall 30mg  BID    Monitor BP between visits while taking stimulant medication.   RTC 2 to 3 months    25 minutes spent dedicated to the care of this patient on the date of this encounter to include pre-visit review of records, ordering of medication, post visit documentation, and face-to-face time with the patient discussing MDD, ADHD, insomnia, and mixed obsessional thoughts and acts. Discussed continuing current medication regimen.  Patient advised to contact office with any questions, adverse  effects, or acute worsening in signs and symptoms.  Discussed potential benefits, risks, and side effects of stimulants with patient to include increased heart rate, palpitations, insomnia, increased anxiety, increased irritability, or decreased appetite.  Instructed patient to contact office if experiencing any significant tolerability issues.  Discussed potential benefits, risk, and side effects of benzodiazepines to include potential risk of tolerance and dependence, as well as possible drowsiness. Advised patient not to drive if experiencing drowsiness and to take lowest possible effective dose to minimize risk of dependence and tolerance.  There are no diagnoses linked to this encounter.   Please see After Visit Summary for patient specific instructions.  Future Appointments  Date Time Provider Department Center  10/18/2024  5:00 PM Fariha Goto Nattalie, NP [facility] None    No orders of the defined types were placed in this encounter.     -------------------------------

## 2024-12-28 ENCOUNTER — Telehealth: Payer: Self-pay | Admitting: Adult Health

## 2024-12-28 DIAGNOSIS — G4700 Insomnia, unspecified: Secondary | ICD-10-CM

## 2024-12-28 DIAGNOSIS — F902 Attention-deficit hyperactivity disorder, combined type: Secondary | ICD-10-CM

## 2024-12-28 MED ORDER — AMPHETAMINE-DEXTROAMPHETAMINE 30 MG PO TABS: 30.0000 mg | ORAL_TABLET | Freq: Two times a day (BID) | ORAL | 0 refills | Status: DC

## 2024-12-28 MED ORDER — ZOLPIDEM TARTRATE 10 MG PO TABS: 10.0000 mg | ORAL_TABLET | Freq: Every evening | ORAL | 0 refills | Status: DC | PRN

## 2024-12-28 NOTE — Telephone Encounter (Signed)
 Please call to schedule FU, was due in Nov.

## 2024-12-28 NOTE — Telephone Encounter (Signed)
 Pended Adderall and zolpidem  - enough to appt.

## 2024-12-28 NOTE — Telephone Encounter (Signed)
 Patient called in for Adderall 30 and Ambien  10mg . PH: [phone] Pharmacy [facility] [address] [place], [place]

## 2024-12-28 NOTE — Telephone Encounter (Signed)
 Pt made appt 1/20. Is req meds be sent in until then

## 2025-01-11 ENCOUNTER — Encounter: Payer: Self-pay | Admitting: Adult Health

## 2025-01-11 ENCOUNTER — Telehealth: Payer: Self-pay | Admitting: Adult Health

## 2025-01-11 DIAGNOSIS — F3341 Major depressive disorder, recurrent, in partial remission: Secondary | ICD-10-CM

## 2025-01-11 DIAGNOSIS — F909 Attention-deficit hyperactivity disorder, unspecified type: Secondary | ICD-10-CM

## 2025-01-11 DIAGNOSIS — G4700 Insomnia, unspecified: Secondary | ICD-10-CM

## 2025-01-11 DIAGNOSIS — F902 Attention-deficit hyperactivity disorder, combined type: Secondary | ICD-10-CM

## 2025-01-11 DIAGNOSIS — F329 Major depressive disorder, single episode, unspecified: Secondary | ICD-10-CM

## 2025-01-11 DIAGNOSIS — F422 Mixed obsessional thoughts and acts: Secondary | ICD-10-CM

## 2025-01-11 MED ORDER — AMPHETAMINE-DEXTROAMPHETAMINE 30 MG PO TABS: 30.0000 mg | ORAL_TABLET | Freq: Two times a day (BID) | ORAL | 0 refills | Status: AC

## 2025-01-11 MED ORDER — ESCITALOPRAM OXALATE 20 MG PO TABS: 20.0000 mg | ORAL_TABLET | Freq: Every day | ORAL | 2 refills | Status: AC

## 2025-01-11 MED ORDER — ZOLPIDEM TARTRATE 10 MG PO TABS: 10.0000 mg | ORAL_TABLET | Freq: Every evening | ORAL | 2 refills | Status: AC | PRN

## 2025-01-11 NOTE — Progress Notes (Signed)
 Tristan Mason [id number] 12/26/1995 29 y.o.  Virtual Visit via Video Note  I connected with pt @ on 01/11/25 at  8:30 AM EST by a video enabled telemedicine application and verified that I am speaking with the correct person using two identifiers.   I discussed the limitations of evaluation and management by telemedicine and the availability of in person appointments. The patient expressed understanding and agreed to proceed.  I discussed the assessment and treatment plan with the patient. The patient was provided an opportunity to ask questions and all were answered. The patient agreed with the plan and demonstrated an understanding of the instructions.   The patient was advised to call back or seek an in-person evaluation if the symptoms worsen or if the condition fails to improve as anticipated.  I provided 25 minutes of non-face-to-face time during this encounter.  The patient was located at home.  The provider was located at [facility].   Angeline LOISE Sayers, NP   Subjective:   Patient ID:  Tristan Mason is a 29 y.o. (DOB [birth year]) male.  Chief Complaint: No chief complaint on file.   HPI Tristan Mason presents for follow-up of MDD, ADHD, insomnia, and mixed obsessional thoughts and acts.  Describes mood today as ok. Pleasant. Denies tearfulness. Mood symptoms - reports some depression nothing out of the ordinary. Denies anxiety and irritability. Reports stable interest and motivation. Denies recent panic attacks. Denies worry, rumination and over thinking. Reports mood is consistent. Stating I feel like I'm doing ok. Feels like medications are helpful. Taking medications as prescribed.  Energy levels stable. Active, walking daily - current goal is 5,000 steps. Enjoys some usual interests and activities. Lives with girlfriend. Family in [place]. Appetite adequate. Weight loss - 205 pounds. Sleeping better some nights than others.  Averages 6 to 9 hours.  Reports some sleep paralysis. Focus and concentration stable. Completing tasks. Managing aspects of household. 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  Denies SI or HI.  Denies AH or VH. Denies self harm. Denies substance use.  Previous medication trials: Vyvanse , Adderall IR   Review of Systems:  Review of Systems  Musculoskeletal:  Negative for gait problem.  Neurological:  Negative for tremors.  Psychiatric/Behavioral:         Please refer to HPI    Medications: I have reviewed the patient's current medications.  Current Outpatient Medications  Medication Sig Dispense Refill   amphetamine -dextroamphetamine  (ADDERALL) 30 MG tablet Take 1 tablet by mouth 2 (two) times daily. 60 tablet 0   amphetamine -dextroamphetamine  (ADDERALL) 30 MG tablet Take 1 tablet by mouth 2 (two) times daily. 60 tablet 0   amphetamine -dextroamphetamine  (ADDERALL) 30 MG tablet Take 1 tablet by mouth 2 (two) times daily. 28 tablet 0   diazepam  (VALIUM ) 10 MG tablet Take 1 tablet (10 mg total) by mouth at bedtime. 30 tablet 0   escitalopram  (LEXAPRO ) 20 MG tablet Take 1 tablet (20 mg total) by mouth at bedtime. 30 tablet 5   zolpidem  (AMBIEN ) 10 MG tablet Take 1 tablet (10 mg total) by mouth at bedtime as needed for sleep. 14 tablet 0   No current facility-administered medications for this visit.    Medication Side Effects: None  Allergies: Allergies[1]  Past Medical History:  Diagnosis Date   ADHD (attention deficit hyperactivity disorder)    Dyslexia    ODD (oppositional defiant disorder)     No family history on file.  Social History  Socioeconomic History   Marital status: Single    Spouse name: Not on file   Number of children: Not on file   Years of education: Not on file   Highest education level: Not on file  Occupational History   Not on file  Tobacco Use   Smoking status: Some Days    Types: Cigarettes   Smokeless tobacco: Former  Psychologist, Educational Use    Vaping status: Every Day  Substance and Sexual Activity   Alcohol use: No   Drug use: Yes    Types: Marijuana    Comment: 3 times weekly only on weekend and after 1800 expecting less when school in session   Sexual activity: Yes    Partners: Female  Other Topics Concern   Not on file  Social History Narrative   Not on file   Social Drivers of Health   Tobacco Use: Low Risk (12/13/2024)   Received from [facility]   Patient History    Smoking Tobacco Use: Never    Smokeless Tobacco Use: Never    Passive Exposure: Never  Recent Concern: Tobacco Use - High Risk (10/18/2024)   Patient History    Smoking Tobacco Use: Some Days    Smokeless Tobacco Use: Former    Passive Exposure: Not on Actuary Strain: Not on file  Food Insecurity: Not on file  Transportation Needs: Not on file  Physical Activity: Not on file  Stress: Not on file  Social Connections: Unknown (02/19/2023)   Received from [facility]   Social Network    Social Network: Not on file  Intimate Partner Violence: Unknown (02/19/2023)   Received from [facility]   HITS    Physically Hurt: Not on file    Insult or Talk Down To: Not on file    Threaten Physical Harm: Not on file    Scream or Curse: Not on file  Depression (EYV7-0): Not on file  Alcohol Screen: Not on file  Housing: Not on file  Utilities: Not on file  Health Literacy: Not on file    Past Medical History, Surgical history, Social history, and Family history were reviewed and updated as appropriate.   Please see review of systems for further details on the patient's review from today.   Objective:   Physical Exam:  There were no vitals taken for this visit.  Physical Exam Constitutional:      General: He is not in acute distress. Musculoskeletal:        General: No deformity.  Neurological:     Mental Status: He is alert and oriented to person, place, and time.     Coordination: Coordination normal.   Psychiatric:        Attention and Perception: Attention and perception normal. He does not perceive auditory or visual hallucinations.        Mood and Affect: Mood normal. Mood is not anxious or depressed. Affect is not labile, blunt, angry or inappropriate.        Speech: Speech normal.        Behavior: Behavior normal.        Thought Content: Thought content normal. Thought content is not paranoid or delusional. Thought content does not include homicidal or suicidal ideation. Thought content does not include homicidal or suicidal plan.        Cognition and Memory: Cognition and memory normal.        Judgment: Judgment normal.     Comments: Insight intact  Lab Review:     Component Value Date/Time   NA 137 02/01/2015 0639   K 3.8 02/01/2015 0639   CL 103 02/01/2015 0639   CO2 28 02/01/2015 0639   GLUCOSE 86 02/01/2015 0639   BUN 16 02/01/2015 0639   CREATININE 0.82 02/01/2015 0639   CALCIUM 9.2 02/01/2015 0639   PROT 7.2 02/01/2015 0639   ALBUMIN 4.6 02/01/2015 0639   AST 17 02/01/2015 0639   ALT 13 02/01/2015 0639   ALKPHOS 64 02/01/2015 0639   BILITOT 1.3 (H) 02/01/2015 0639   GFRNONAA NOT CALCULATED 02/01/2015 0639   GFRAA NOT CALCULATED 02/01/2015 0639       Component Value Date/Time   WBC 6.7 02/01/2015 0639   RBC 4.70 02/01/2015 0639   HGB 14.6 02/01/2015 0639   HCT 42.3 02/01/2015 0639   PLT 222 02/01/2015 0639   MCV 90.0 02/01/2015 0639   MCH 31.1 02/01/2015 0639   MCHC 34.5 02/01/2015 0639   RDW 12.5 02/01/2015 0639   LYMPHSABS 1.5 05/20/2014 1645   MONOABS 1.0 05/20/2014 1645   EOSABS 0.1 05/20/2014 1645   BASOSABS 0.0 05/20/2014 1645    No results found for: POCLITH, LITHIUM   No results found for: PHENYTOIN, PHENOBARB, VALPROATE, CBMZ   .res Assessment: Plan:   Plan:  PDMP reviewed  Ambien  10mg  daily - taking 1/2 to one tablet Lexapro  20mg  daily Adderall 30mg  BID    Monitor BP between visits while taking stimulant  medication.   RTC 2 to 3 months    25 minutes spent dedicated to the care of this patient on the date of this encounter to include pre-visit review of records, ordering of medication, post visit documentation, and face-to-face time with the patient discussing MDD, ADHD, insomnia, and mixed obsessional thoughts and acts. Discussed continuing current medication regimen.  Patient advised to contact office with any questions, adverse effects, or acute worsening in signs and symptoms.  Discussed potential benefits, risks, and side effects of stimulants with patient to include increased heart rate, palpitations, insomnia, increased anxiety, increased irritability, or decreased appetite.  Instructed patient to contact office if experiencing any significant tolerability issues.  Discussed potential benefits, risk, and side effects of benzodiazepines to include potential risk of tolerance and dependence, as well as possible drowsiness. Advised patient not to drive if experiencing drowsiness and to take lowest possible effective dose to minimize risk of dependence and tolerance. There are no diagnoses linked to this encounter.   Please see After Visit Summary for patient specific instructions.  Future Appointments  Date Time Provider Department Center  01/11/2025  8:30 AM Dreya Buhrman Nattalie, NP [facility] None    No orders of the defined types were placed in this encounter.     -------------------------------     [1] No Known Allergies
